# Patient Record
Sex: Female | Born: 1945 | Race: Black or African American | Hispanic: No | Marital: Single | Smoking: Never smoker
Health system: Southern US, Community
[De-identification: ages and names within clinical notes are randomized; demographics above are authoritative.]

---

## 2007-08-15 ENCOUNTER — Encounter: Admission: RE | Admit: 2007-08-15 | Discharge: 2007-11-13 | Payer: Self-pay | Admitting: Orthopedic Surgery

## 2007-09-12 ENCOUNTER — Ambulatory Visit (HOSPITAL_COMMUNITY): Admission: RE | Admit: 2007-09-12 | Discharge: 2007-09-12 | Payer: Self-pay | Admitting: Internal Medicine

## 2009-03-24 ENCOUNTER — Emergency Department (HOSPITAL_BASED_OUTPATIENT_CLINIC_OR_DEPARTMENT_OTHER): Admission: EM | Admit: 2009-03-24 | Discharge: 2009-03-24 | Payer: Self-pay | Admitting: Emergency Medicine

## 2014-10-03 DIAGNOSIS — C50312 Malignant neoplasm of lower-inner quadrant of left female breast: Secondary | ICD-10-CM | POA: Insufficient documentation

## 2014-10-04 DIAGNOSIS — Z8711 Personal history of peptic ulcer disease: Secondary | ICD-10-CM | POA: Insufficient documentation

## 2014-10-04 DIAGNOSIS — I10 Essential (primary) hypertension: Secondary | ICD-10-CM | POA: Insufficient documentation

## 2014-10-04 DIAGNOSIS — Z8719 Personal history of other diseases of the digestive system: Secondary | ICD-10-CM | POA: Insufficient documentation

## 2014-10-04 DIAGNOSIS — K635 Polyp of colon: Secondary | ICD-10-CM | POA: Insufficient documentation

## 2016-07-26 DIAGNOSIS — E119 Type 2 diabetes mellitus without complications: Secondary | ICD-10-CM | POA: Insufficient documentation

## 2016-08-02 DIAGNOSIS — E782 Mixed hyperlipidemia: Secondary | ICD-10-CM | POA: Insufficient documentation

## 2016-09-22 DIAGNOSIS — Z79811 Long term (current) use of aromatase inhibitors: Secondary | ICD-10-CM | POA: Insufficient documentation

## 2016-09-22 DIAGNOSIS — Z5181 Encounter for therapeutic drug level monitoring: Secondary | ICD-10-CM | POA: Insufficient documentation

## 2016-09-22 DIAGNOSIS — Z9189 Other specified personal risk factors, not elsewhere classified: Secondary | ICD-10-CM | POA: Insufficient documentation

## 2016-11-09 DIAGNOSIS — G8929 Other chronic pain: Secondary | ICD-10-CM | POA: Insufficient documentation

## 2016-11-09 DIAGNOSIS — M5441 Lumbago with sciatica, right side: Secondary | ICD-10-CM

## 2016-11-11 DIAGNOSIS — M5136 Other intervertebral disc degeneration, lumbar region: Secondary | ICD-10-CM | POA: Insufficient documentation

## 2017-01-26 DIAGNOSIS — Z9189 Other specified personal risk factors, not elsewhere classified: Secondary | ICD-10-CM | POA: Insufficient documentation

## 2017-09-14 DIAGNOSIS — M65332 Trigger finger, left middle finger: Secondary | ICD-10-CM | POA: Insufficient documentation

## 2018-06-22 DIAGNOSIS — S52124A Nondisplaced fracture of head of right radius, initial encounter for closed fracture: Secondary | ICD-10-CM | POA: Insufficient documentation

## 2018-06-22 DIAGNOSIS — S42409A Unspecified fracture of lower end of unspecified humerus, initial encounter for closed fracture: Secondary | ICD-10-CM | POA: Insufficient documentation

## 2018-12-22 ENCOUNTER — Other Ambulatory Visit: Payer: Self-pay | Admitting: Podiatry

## 2018-12-22 ENCOUNTER — Encounter: Payer: Self-pay | Admitting: Podiatry

## 2018-12-22 ENCOUNTER — Ambulatory Visit (INDEPENDENT_AMBULATORY_CARE_PROVIDER_SITE_OTHER): Payer: Medicare Other

## 2018-12-22 ENCOUNTER — Ambulatory Visit: Payer: Medicare Other | Admitting: Podiatry

## 2018-12-22 DIAGNOSIS — M7750 Other enthesopathy of unspecified foot: Secondary | ICD-10-CM

## 2018-12-22 DIAGNOSIS — M898X7 Other specified disorders of bone, ankle and foot: Secondary | ICD-10-CM | POA: Diagnosis not present

## 2018-12-22 DIAGNOSIS — M898X9 Other specified disorders of bone, unspecified site: Secondary | ICD-10-CM

## 2018-12-22 DIAGNOSIS — M19071 Primary osteoarthritis, right ankle and foot: Secondary | ICD-10-CM | POA: Diagnosis not present

## 2018-12-22 DIAGNOSIS — M19072 Primary osteoarthritis, left ankle and foot: Secondary | ICD-10-CM | POA: Diagnosis not present

## 2018-12-22 DIAGNOSIS — M19079 Primary osteoarthritis, unspecified ankle and foot: Secondary | ICD-10-CM

## 2018-12-22 DIAGNOSIS — G5763 Lesion of plantar nerve, bilateral lower limbs: Secondary | ICD-10-CM | POA: Diagnosis not present

## 2018-12-23 NOTE — Progress Notes (Signed)
  Subjective:  Patient ID: Angie [name], female    DOB: 07-Dec-1945,  MRN: [id number]  Chief Complaint  Patient presents with  . Foot Pain    Dorsal midfoot bilateral - tender, swollen, knots x several months, certain shoes aggravate, tried bengay  . Foot Pain    Plantar forefoot bilateral and some arch bilateral - aching x few months, intermittent pain, concerned about bunions-previous surgery on right, tried bengay and salonpas patch  . Diabetes    last a1c 6.0  . New Patient (Initial Visit)   73 y.o. female presents with the above complaint.  Above history confirmed with patient.  Review of Systems: Negative except as noted in the HPI. Denies N/V/F/Ch.  No past medical history on file.  Current Outpatient Medications:  .  amLODipine (NORVASC) 10 MG tablet, Take 10 mg by mouth every morning., Disp: , Rfl:  .  hydrochlorothiazide (HYDRODIURIL) 12.5 MG tablet, , Disp: , Rfl:  .  letrozole (FEMARA) 2.5 MG tablet, , Disp: , Rfl:  .  omeprazole (PRILOSEC) 40 MG capsule, Take 40 mg by mouth 2 (two) times daily., Disp: , Rfl:   Social History   Tobacco Use  Smoking Status Never Smoker  Smokeless Tobacco Never Used    Allergies  Allergen Reactions  . Amoxicillin Itching and Rash  . Nsaids Other (See Comments)   Objective:  There were no vitals filed for this visit. There is no height or weight on file to calculate BMI. Constitutional Well developed. Well nourished.  Vascular Dorsalis pedis pulses palpable bilaterally. Posterior tibial pulses palpable bilaterally. Capillary refill normal to all digits.  No cyanosis or clubbing noted. Pedal hair growth normal.  Neurologic Normal speech. Oriented to person, place, and time. Epicritic sensation to light touch grossly present bilaterally.  Dermatologic Nails well groomed and normal in appearance. No open wounds. No skin lesions.  Orthopedic: Normal joint ROM without pain or crepitus bilaterally. Pain palpation about the  dorsal midfoot bilaterally.  Pain to palpation about the third interspace with Mulder's click.  Prior bunionectomy right with healed incision without pain to palpation left HAV deformity noted   Radiographs: Taken and reviewed midfoot degenerative changes noted with dorsal spurring.  Hallux abductovalgus deformity left previous bunion surgery with intact hardware Assessment:   1. Exostosis of bone of foot   2. Arthritis of midfoot   3. Morton's metatarsalgia, neuralgia, or neuroma, bilateral    Plan:  Patient was evaluated and treated and all questions answered.  Midfoot arthritis bilaterally -Injection delivered as below  Procedure: Joint Injection Location: Bilateral dorsal TMTs joint Skin Prep: Alcohol. Injectate: 0.5 cc 1% lidocaine plain, 0.5 cc dexamethasone phosphate. Disposition: Patient tolerated procedure well. Injection site dressed with a band-aid.  Morton's neuroma bilaterally -Injection delivered as below  Procedure: Neuroma Injection Location: Bilateral 3rd interspace Skin Prep: Alcohol. Injectate: 0.5 cc 0.5% marcaine plain, 0.5 cc dexamethasone phosphate. Disposition: Patient tolerated procedure well. Injection site dressed with a band-aid.  Return in about 6 weeks (around 02/02/2019) for Neuroma, Capsulitis.

## 2019-01-18 ENCOUNTER — Ambulatory Visit: Payer: Medicare Other | Admitting: Podiatry

## 2019-03-01 ENCOUNTER — Other Ambulatory Visit: Payer: Self-pay

## 2019-03-01 ENCOUNTER — Ambulatory Visit: Payer: Medicare Other | Admitting: Podiatry

## 2019-03-01 VITALS — Temp 97.9°F

## 2019-03-01 DIAGNOSIS — M19079 Primary osteoarthritis, unspecified ankle and foot: Secondary | ICD-10-CM | POA: Diagnosis not present

## 2019-03-01 DIAGNOSIS — M898X7 Other specified disorders of bone, ankle and foot: Secondary | ICD-10-CM | POA: Diagnosis not present

## 2019-03-01 DIAGNOSIS — G5763 Lesion of plantar nerve, bilateral lower limbs: Secondary | ICD-10-CM

## 2019-03-01 NOTE — Progress Notes (Signed)
  Subjective:  Patient ID: Angie Angie [name], Angie [name]    DOB: [birth year],  MRN: [id number]  Chief Complaint  Patient presents with  . Foot Pain    Pt states injections are very effective and now only has very mild occasional pain. Pt would like to discuss the "swelling knots" on dorsal feet bilateral"   73 y.o. Angie [name] presents with the above complaint.  Above history confirmed with patient.  Review of Systems: Negative except as noted in the HPI. Denies N/V/F/Ch.  No past medical history on file.  Current Outpatient Medications:  .  amLODipine (NORVASC) 10 MG tablet, Take 10 mg by mouth every morning., Disp: , Rfl:  .  cyclobenzaprine (FLEXERIL) 10 MG tablet, TAKE 1 TABLET BY MOUTH EVERY 3 HOURS, Disp: , Rfl:  .  hydrochlorothiazide (HYDRODIURIL) 12.5 MG tablet, , Disp: , Rfl:  .  letrozole (FEMARA) 2.5 MG tablet, , Disp: , Rfl:  .  omeprazole (PRILOSEC) 40 MG capsule, Take 40 mg by mouth 2 (two) times daily., Disp: , Rfl:  .  traMADol (ULTRAM) 50 MG tablet, TAKE ONE TABLET (50 MG DOSE) BY MOUTH EVERY 6 (SIX) HOURS AS NEEDED FOR UP TO 3 DAYS., Disp: , Rfl:   Social History   Tobacco Use  Smoking Status Never Smoker  Smokeless Tobacco Never Used    Allergies  Allergen Reactions  . Amoxicillin Itching and Rash  . Nsaids Other (See Comments)   Objective:   Vitals:   03/01/19 0919  Temp: 97.9 F (36.6 C)   There is no height or weight on file to calculate BMI. Constitutional Well developed. Well nourished.  Vascular Dorsalis pedis pulses palpable bilaterally. Posterior tibial pulses palpable bilaterally. Capillary refill normal to all digits.  No cyanosis or clubbing noted. Pedal hair growth normal.  Neurologic Normal speech. Oriented to person, place, and time. Epicritic sensation to light touch grossly present bilaterally.  Dermatologic Nails well groomed and normal in appearance. No open wounds. No skin lesions.  Orthopedic: Normal joint ROM without pain or crepitus  bilaterally. Pain palpation about the dorsal midfoot bilaterally.  Pain to palpation about the third interspace with Mulder's click.  Prior bunionectomy right with healed incision without pain to palpation left HAV deformity noted   Radiographs: None today Assessment:   1. Morton's metatarsalgia, neuralgia, or neuroma, bilateral   2. Arthritis of midfoot   3. Exostosis of bone of foot    Plan:  Patient was evaluated and treated and all questions answered.  Midfoot Arthritis -Improved. No pain today.  Morton's neuroma bilaterally -Pain resolved. Nerve symptoms remain. Explained etiology. Declines injection.  Return in about 2 months (around 05/01/2019) for Neuroma, Bilateral.

## 2019-04-13 DIAGNOSIS — Z8544 Personal history of malignant neoplasm of other female genital organs: Secondary | ICD-10-CM | POA: Insufficient documentation

## 2019-05-03 ENCOUNTER — Ambulatory Visit: Payer: Medicare Other | Admitting: Podiatry

## 2019-05-16 DIAGNOSIS — Z9189 Other specified personal risk factors, not elsewhere classified: Secondary | ICD-10-CM | POA: Insufficient documentation

## 2019-06-24 DIAGNOSIS — Z96653 Presence of artificial knee joint, bilateral: Secondary | ICD-10-CM | POA: Insufficient documentation

## 2019-07-13 DIAGNOSIS — C519 Malignant neoplasm of vulva, unspecified: Secondary | ICD-10-CM | POA: Insufficient documentation

## 2020-07-03 ENCOUNTER — Ambulatory Visit: Payer: Self-pay | Admitting: Family Medicine

## 2020-07-03 ENCOUNTER — Encounter: Payer: Self-pay | Admitting: Family Medicine

## 2020-07-03 ENCOUNTER — Ambulatory Visit: Payer: Self-pay

## 2020-07-03 ENCOUNTER — Other Ambulatory Visit: Payer: Self-pay

## 2020-07-03 VITALS — BP 149/96 | HR 66 | Ht 68.0 in | Wt 274.0 lb

## 2020-07-03 DIAGNOSIS — S63502A Unspecified sprain of left wrist, initial encounter: Secondary | ICD-10-CM | POA: Insufficient documentation

## 2020-07-03 DIAGNOSIS — M25532 Pain in left wrist: Secondary | ICD-10-CM

## 2020-07-03 NOTE — Progress Notes (Signed)
Angie [name] - 74 y.o. female MRN [id number]  Date of birth: Jul 22, 1946  SUBJECTIVE:  Including CC & ROS.  Chief Complaint  Patient presents with  . Wrist Injury    left x 06/26/2020    Angie [name] is a 74 y.o. female that is presenting with left wrist pain.  She had a fall over about a week ago.  She was seen in the emergency department today and x-rays were taken which were negative for fracture.  She is having pain over the ulnar aspect of the wrist.  No history of previous injury or pain to the area..  Review of the left wrist x-ray from 9/9 shows soft tissue swelling.   Review of Systems See HPI   HISTORY: Past Medical, Surgical, Social, and Family History Reviewed & Updated per EMR.   Pertinent Historical Findings include:  History reviewed. No pertinent past medical history.  History reviewed. No pertinent surgical history.  History reviewed. No pertinent family history.  Social History   Socioeconomic History  . Marital status: Single    Spouse name: Not on file  . Number of children: Not on file  . Years of education: Not on file  . Highest education level: Not on file  Occupational History  . Not on file  Tobacco Use  . Smoking status: Never Smoker  . Smokeless tobacco: Never Used  Substance and Sexual Activity  . Alcohol use: Not Currently  . Drug use: Not on file  . Sexual activity: Not on file  Other Topics Concern  . Not on file  Social History Narrative  . Not on file   Social Determinants of Health   Financial Resource Strain:   . Difficulty of Paying Living Expenses: Not on file  Food Insecurity:   . Worried About Charity fundraiser in the Last Year: Not on file  . Ran Out of Food in the Last Year: Not on file  Transportation Needs:   . Lack of Transportation (Medical): Not on file  . Lack of Transportation (Non-Medical): Not on file  Physical Activity:   . Days of Exercise per Week: Not on file  . Minutes of Exercise per Session: Not on  file  Stress:   . Feeling of Stress : Not on file  Social Connections:   . Frequency of Communication with Friends and Family: Not on file  . Frequency of Social Gatherings with Friends and Family: Not on file  . Attends Religious Services: Not on file  . Active Member of Clubs or Organizations: Not on file  . Attends Archivist Meetings: Not on file  . Marital Status: Not on file  Intimate Partner Violence:   . Fear of Current or Ex-Partner: Not on file  . Emotionally Abused: Not on file  . Physically Abused: Not on file  . Sexually Abused: Not on file     PHYSICAL EXAM:  VS: BP (!) 149/96   Pulse 66   Ht 5\' 8"  (1.727 m)   Wt 274 lb (124.3 kg)   BMI 41.66 kg/m  Physical Exam Gen: NAD, alert, cooperative with exam, well-appearing MSK:  Left wrist: Tenderness palpation of the TFCC. Normal range of motion. No ecchymosis or swelling. Normal grip strength. Neurovascularly intact  Limited ultrasound: Left wrist:  Normal-appearing ECU midsubstance and at the insertion. No changes at the base of the fifth metacarpal. Mild effusion of the carpal bones. No increased hyperemia. No fracture of the distal ulna or radius  Summary: Mild effusion of the wrist joint.  Ultrasound and interpretation by Clearance Coots, MD   ASSESSMENT & PLAN:   Wrist sprain, left, initial encounter Injury occurred on 9/2.  No fracture identified on initial x-ray.  No ligamentous or tendon abnormalities observed on ultrasound today.  May have an exacerbation of underlying degenerative changes. -Counseled on supportive care. -Brace. -Follow-up and would consider reimaging.

## 2020-07-03 NOTE — Patient Instructions (Signed)
Nice to meet you Please try the brace  Please try ice  Please try the range of motion movements   Please send me a message in MyChart with any questions or updates.  Please see me back in 1-2 weeks.   --Dr. Raeford Razor

## 2020-07-03 NOTE — Assessment & Plan Note (Signed)
Injury occurred on 9/2.  No fracture identified on initial x-ray.  No ligamentous or tendon abnormalities observed on ultrasound today.  May have an exacerbation of underlying degenerative changes. -Counseled on supportive care. -Brace. -Follow-up and would consider reimaging.

## 2020-07-17 ENCOUNTER — Ambulatory Visit: Payer: Self-pay | Admitting: Family Medicine

## 2020-09-02 ENCOUNTER — Telehealth: Payer: Self-pay | Admitting: Podiatry

## 2020-09-02 NOTE — Telephone Encounter (Signed)
Pt left message with only name and but then called back asking about getting another pair of orthotics.  Upon discussion pt was talking about powersteps not custom orthotics and I told pt she could just stop by the [facility] office and purchase a pair of powersteps for 55.00 plus tax. She said thank you.

## 2020-09-02 NOTE — Telephone Encounter (Signed)
Got it - thanks.

## 2020-10-03 ENCOUNTER — Ambulatory Visit: Payer: Medicare Other | Admitting: Podiatry

## 2020-10-10 ENCOUNTER — Ambulatory Visit (INDEPENDENT_AMBULATORY_CARE_PROVIDER_SITE_OTHER): Payer: Medicare Other

## 2020-10-10 ENCOUNTER — Other Ambulatory Visit: Payer: Self-pay

## 2020-10-10 ENCOUNTER — Ambulatory Visit (INDEPENDENT_AMBULATORY_CARE_PROVIDER_SITE_OTHER): Payer: Medicare Other | Admitting: Podiatry

## 2020-10-10 DIAGNOSIS — M722 Plantar fascial fibromatosis: Secondary | ICD-10-CM | POA: Diagnosis not present

## 2020-10-10 DIAGNOSIS — M79671 Pain in right foot: Secondary | ICD-10-CM

## 2020-10-10 MED ORDER — BETAMETHASONE SOD PHOS & ACET 6 (3-3) MG/ML IJ SUSP
6.0000 mg | Freq: Once | INTRAMUSCULAR | Status: AC
  Administered 2020-10-10: 6 mg

## 2020-10-21 ENCOUNTER — Other Ambulatory Visit: Payer: Self-pay | Admitting: Podiatry

## 2020-10-21 DIAGNOSIS — M722 Plantar fascial fibromatosis: Secondary | ICD-10-CM

## 2020-10-24 NOTE — Progress Notes (Signed)
  Subjective:  Patient ID: Zavannah Deblois Bernick, female    DOB: [birth year],  MRN: [id number]  Chief Complaint  Patient presents with  . Foot Pain    Right plantar heel pain 1 month duration no known injuries  . Leg Problem    Pt states right lower extremity swelling, no known injuries.    74 y.o. female presents with the above complaint. History confirmed with patient.   Objective:  Physical Exam: warm, good capillary refill, no trophic changes or ulcerative lesions, normal DP and PT pulses and normal sensory exam. Right Foot: tenderness to palpation medial calcaneal tuber, no pain with calcaneal squeeze, decreased ankle joint ROM and +Silverskiold test normal exam, no swelling, tenderness, instability; ligaments intact, full range of motion of all ankle/foot joints other than findings noted above.  Radiographs: X-ray of the right foot: no evidence of calcaneal stress fracture, plantar calcaneal spur, posterior calcaneal spur and Haglund deformity noted  Assessment:   1. Plantar fasciitis      Plan:  Patient was evaluated and treated and all questions answered.  Plantar Fasciitis -XR reviewed with patient -Educated patient on stretching and icing of the affected limb -Plantar fascial brace dispensed -Injection delivered to the plantar fascia of the right foot.  Procedure: Injection Tendon/Ligament Consent: Verbal consent obtained. Location: Right plantar fascia at the glabrous junction; medial approach. Skin Prep: Alcohol. Injectate: 1 cc 0.5% marcaine plain, 1 cc dexamethasone phosphate, 0.5 cc kenalog 10. Disposition: Patient tolerated procedure well. Injection site dressed with a band-aid.    Return in about 1 month (around 11/10/2020) for Plantar fasciitis.

## 2020-11-18 ENCOUNTER — Ambulatory Visit: Payer: Medicare Other | Admitting: Podiatry

## 2020-11-18 ENCOUNTER — Other Ambulatory Visit: Payer: Self-pay

## 2020-11-18 DIAGNOSIS — G5763 Lesion of plantar nerve, bilateral lower limbs: Secondary | ICD-10-CM | POA: Diagnosis not present

## 2020-11-18 DIAGNOSIS — M19079 Primary osteoarthritis, unspecified ankle and foot: Secondary | ICD-10-CM

## 2020-11-18 DIAGNOSIS — M722 Plantar fascial fibromatosis: Secondary | ICD-10-CM

## 2020-11-18 DIAGNOSIS — M2012 Hallux valgus (acquired), left foot: Secondary | ICD-10-CM

## 2020-11-18 NOTE — Progress Notes (Signed)
  Subjective:  Patient ID: Angie [name], female    DOB: 01-01-1946,  MRN: [id number]  Chief Complaint  Patient presents with  . Plantar Fasciitis    F/U Lt PF:  Pt states,' doing some what better, still with some pain; 4/10." - w/ occasional swelling with prolong standing tx: brace, stretching and icing   75 y.o. female presents with the above complaint. History confirmed with patient. New complaint of left foot bunion that occasionally causes pain.  Objective:  Physical Exam: warm, good capillary refill, no trophic changes or ulcerative lesions, normal DP and PT pulses and normal sensory exam. Right Foot: tenderness to palpation medial calcaneal tuber, no pain with calcaneal squeeze, decreased ankle joint ROM and +Silverskiold test normal exam, no swelling, tenderness, instability; ligaments intact, full range of motion of all ankle/foot joints other than findings noted above Left foot: HAV let, prominent bursa with POP. Scant discoloration no active warmth or erythema.  Assessment:   1. Plantar fasciitis   2. Hallux valgus, left   3. Morton's metatarsalgia, neuralgia, or neuroma, bilateral   4. Arthritis of midfoot      Plan:  Patient was evaluated and treated and all questions answered.  Plantar Fasciitis -Defer injection today. -Continue use of plantar fascial brace for 2 weeks.  Left foot HAV -Discussed possible surgical correction, patient to think it over. Would consider MIS approach. -Tubefoam bunion shield dispensed. -XR next visit for further eval of bunion, possible surgical discussion.  Return in about 6 weeks (around 12/30/2020) for Plantar fasciitis R, Bunion L.

## 2020-12-30 ENCOUNTER — Ambulatory Visit (INDEPENDENT_AMBULATORY_CARE_PROVIDER_SITE_OTHER): Payer: Medicare Other | Admitting: Podiatry

## 2020-12-30 ENCOUNTER — Other Ambulatory Visit: Payer: Self-pay

## 2020-12-30 DIAGNOSIS — M722 Plantar fascial fibromatosis: Secondary | ICD-10-CM

## 2020-12-30 DIAGNOSIS — M2012 Hallux valgus (acquired), left foot: Secondary | ICD-10-CM

## 2020-12-30 NOTE — Patient Instructions (Signed)
Charge for [employer]

## 2021-01-22 NOTE — Progress Notes (Signed)
  Subjective:  Patient ID: Chelsye Suhre Toner, female    DOB: 08-27-1946,  MRN: [id number]  Chief Complaint  Patient presents with  . Bunions  . Plantar Fasciitis    Plantar fasciitis R, Bunion L 6 wk fu   75 y.o. female presents with the above complaint. History confirmed with patient.  States that during the day she is on her feet a lot and the feet still hurt a bit but she feels improvement with the brace.  Would like to continue trying her orthotics  Objective:  Physical Exam: warm, good capillary refill, no trophic changes or ulcerative lesions, normal DP and PT pulses and normal sensory exam. Right Foot: tenderness to palpation medial calcaneal tuber, no pain with calcaneal squeeze, decreased ankle joint ROM and +Silverskiold test normal exam, no swelling, tenderness, instability; ligaments intact, full range of motion of all ankle/foot joints other than findings noted above Left foot: HAV let, prominent bursa with POP. Scant discoloration no active warmth or erythema.  Assessment:   1. Plantar fasciitis   2. Hallux valgus, left      Plan:  Patient was evaluated and treated and all questions answered.  Plantar Fasciitis -Improved  Left foot HAV -Offered possible surgical intervention, patient not interested at this time.  Return if symptoms worsen or fail to improve.

## 2021-04-17 ENCOUNTER — Ambulatory Visit (INDEPENDENT_AMBULATORY_CARE_PROVIDER_SITE_OTHER): Payer: Medicare Other

## 2021-04-17 ENCOUNTER — Ambulatory Visit (INDEPENDENT_AMBULATORY_CARE_PROVIDER_SITE_OTHER): Payer: Medicare Other | Admitting: Podiatry

## 2021-04-17 ENCOUNTER — Other Ambulatory Visit: Payer: Self-pay

## 2021-04-17 ENCOUNTER — Other Ambulatory Visit: Payer: Self-pay | Admitting: Podiatry

## 2021-04-17 DIAGNOSIS — M722 Plantar fascial fibromatosis: Secondary | ICD-10-CM

## 2021-04-17 DIAGNOSIS — G5763 Lesion of plantar nerve, bilateral lower limbs: Secondary | ICD-10-CM

## 2021-04-17 DIAGNOSIS — M79672 Pain in left foot: Secondary | ICD-10-CM

## 2021-04-17 DIAGNOSIS — M2012 Hallux valgus (acquired), left foot: Secondary | ICD-10-CM | POA: Diagnosis not present

## 2021-04-17 NOTE — Progress Notes (Signed)
  Subjective:  Patient ID: Angie [name], female    DOB: [birth year],  MRN: [id number]  Chief Complaint  Patient presents with   Bunions    Left bunion pain   Numbness    Pt states numbness in bilateral plantar forefoot   75 y.o. female presents with the above complaint. History confirmed with patient.   Objective:  Physical Exam: warm, good capillary refill, no trophic changes or ulcerative lesions, normal DP and PT pulses and normal sensory exam. Right Foot: tenderness to palpation medial calcaneal tuber, no pain with calcaneal squeeze, decreased ankle joint ROM and +Silverskiold test normal exam, no swelling, tenderness, instability; ligaments intact, full range of motion of all ankle/foot joints other than findings noted above Left foot: HAV let, prominent bursa with POP. Scant discoloration no active warmth or erythema.  Assessment:   1. Hallux valgus, left      Plan:  Patient was evaluated and treated and all questions answered.  Plantar Fasciitis -Improved  Left foot HAV -Patient has failed all conservative therapy and wishes to proceed with surgical intervention. All risks, benefits, and alternatives discussed with patient. No guarantees given. Consent reviewed and signed by patient. -Planned procedures: left foot correction of HAV deformity with Liane Comber bunionectomy -ASA 3 - Patient with moderate systemic disease with functional limitations; Risk factors -Post-op anticoagulation: chemoprophylaxis not indicated -DME dispensed for post-op use: CAM Boot -IVS RUE only given hx of breast surgery affecting LUE.  Morton neuroma bilat -Offered concomitant excision at time of surgery for bunion, pt declined -Continue CMOs with metatarsal pads.  Return for Post-op Care.

## 2021-04-20 ENCOUNTER — Telehealth: Payer: Self-pay | Admitting: Urology

## 2021-04-20 NOTE — Telephone Encounter (Signed)
DOS - 05/13/21   [place] BUNIONECTOMY LEFT --- [id number]   [employer] EFFECTIVE DATE - 10/25/20   PLAN DEDUCTIBLE - $0.00 OUT OF POCKET - $2,500.00 W/ $2,385.00 REMAINING COINSURANCE - 0%  COPAY - $50.00   PER [employer] WEB SITE CPT CODE 82641 Notification or Prior Authorization is not required for the requested services   Decision ID #:[id number]

## 2021-05-13 ENCOUNTER — Other Ambulatory Visit: Payer: Self-pay | Admitting: Podiatry

## 2021-05-13 ENCOUNTER — Encounter: Payer: Self-pay | Admitting: Podiatry

## 2021-05-13 DIAGNOSIS — M2012 Hallux valgus (acquired), left foot: Secondary | ICD-10-CM | POA: Diagnosis not present

## 2021-05-13 MED ORDER — CLINDAMYCIN HCL 150 MG PO CAPS: 150.0000 mg | ORAL_CAPSULE | Freq: Two times a day (BID) | ORAL | 0 refills | Status: AC

## 2021-05-13 MED ORDER — OXYCODONE-ACETAMINOPHEN 5-325 MG PO TABS: 1.0000 | ORAL_TABLET | ORAL | 0 refills | Status: DC | PRN

## 2021-05-14 ENCOUNTER — Telehealth: Payer: Self-pay | Admitting: Podiatry

## 2021-05-14 ENCOUNTER — Telehealth: Payer: Self-pay | Admitting: *Deleted

## 2021-05-14 DIAGNOSIS — M2012 Hallux valgus (acquired), left foot: Secondary | ICD-10-CM

## 2021-05-14 NOTE — Addendum Note (Signed)
Addended by: Hardie Pulley on: 05/14/2021 01:36 PM   Modules accepted: Orders

## 2021-05-14 NOTE — Telephone Encounter (Signed)
Pt called and stated that she need Dr. March Rummage to send over a prescription to [place] for a Wheelchair.  Patient had sx yesterday  Please advise

## 2021-05-14 NOTE — Telephone Encounter (Signed)
Faxed wheelchair to [facility] in [place] per patient's request,received confirmation.

## 2021-05-18 NOTE — Telephone Encounter (Signed)
I faxed over an order to [facility] for a wheel chair for patient today. Angie [name]

## 2021-05-19 ENCOUNTER — Ambulatory Visit (INDEPENDENT_AMBULATORY_CARE_PROVIDER_SITE_OTHER): Payer: Medicare Other | Admitting: Podiatry

## 2021-05-19 ENCOUNTER — Other Ambulatory Visit: Payer: Self-pay

## 2021-05-19 ENCOUNTER — Ambulatory Visit (INDEPENDENT_AMBULATORY_CARE_PROVIDER_SITE_OTHER): Payer: Medicare Other

## 2021-05-19 DIAGNOSIS — Z9889 Other specified postprocedural states: Secondary | ICD-10-CM

## 2021-05-19 DIAGNOSIS — M2012 Hallux valgus (acquired), left foot: Secondary | ICD-10-CM

## 2021-05-19 NOTE — Progress Notes (Signed)
  Subjective:  Patient ID: Angie [name], female    DOB: 08-11-1946,  MRN: [id number]  Chief Complaint  Patient presents with   Routine Post Op    POV #1 DOS 05/13/2021 [facility] BUNIONECTOMY LT. Pt is doing well. Some edema today.     DOS: 05/13/21 Procedure: Liane Comber Bunionectomy left  75 y.o. female presents with the above complaint. History confirmed with patient.   Objective:  Physical Exam: tenderness at the surgical site, local edema noted, and calf supple, nontender. Incision: healing well, no significant drainage, no dehiscence, no significant erythema  No images are attached to the encounter.  Radiographs: X-ray of the left foot: consistent with post-op state, with good alignment and no evidence of hardware complication  Assessment:   1. Post-operative state   2. Hallux valgus, left     Plan:  Patient was evaluated and treated and all questions answered.  Post-operative State -XR reviewed with patient -Dressing applied consisting of sterile gauze and kerlix -WBAT in CAM boot -XRs needed at follow-up: none  No follow-ups on file.

## 2021-05-20 ENCOUNTER — Telehealth: Payer: Self-pay | Admitting: *Deleted

## 2021-05-20 NOTE — Telephone Encounter (Signed)
Patient is calling for the status of an order for a wheelchair from [facility]. Please advise.  Called patient to inform that order was sent to [facility] ,she said that she wanted it sent to the [facility] office.  Resent  order to the [facility] office , patient aware.

## 2021-05-28 ENCOUNTER — Other Ambulatory Visit: Payer: Self-pay | Admitting: *Deleted

## 2021-05-28 MED ORDER — OXYCODONE-ACETAMINOPHEN 5-325 MG PO TABS: 1.0000 | ORAL_TABLET | ORAL | 0 refills | Status: AC | PRN

## 2021-05-28 NOTE — Telephone Encounter (Signed)
Was refilled in separate encounter

## 2021-05-28 NOTE — Telephone Encounter (Signed)
Patient is calling to request a refill of pain medication, still having foot pain. Please advise.

## 2021-06-02 ENCOUNTER — Ambulatory Visit (INDEPENDENT_AMBULATORY_CARE_PROVIDER_SITE_OTHER): Payer: Medicare Other | Admitting: Podiatry

## 2021-06-02 ENCOUNTER — Other Ambulatory Visit: Payer: Self-pay

## 2021-06-02 DIAGNOSIS — M2012 Hallux valgus (acquired), left foot: Secondary | ICD-10-CM

## 2021-06-02 DIAGNOSIS — Z9889 Other specified postprocedural states: Secondary | ICD-10-CM

## 2021-06-02 NOTE — Progress Notes (Signed)
  Subjective:  Patient ID: Desare Diers Sprunger, female    DOB: 1946/09/13,  MRN: [id number]  Chief Complaint  Patient presents with   Routine Post Op    POV #2 DOS 05/13/2021 [place] BUNIONECTOMY LT. Pt complains of edema.    DOS: 05/13/21 Procedure: Liane Comber Bunionectomy left  75 y.o. female presents with the above complaint. History confirmed with patient.   Objective:  Physical Exam: tenderness at the surgical site, local edema noted, and calf supple, nontender. Incision: healing well, no significant drainage, no dehiscence, no significant erythema   Assessment:   1. Hallux valgus, left   2. Post-operative state    Plan:  Patient was evaluated and treated and all questions answered.  Post-operative State -Sutures removed -Dressing applied consisting of ACE bandage -WBAT in CAM boot -XRs needed at follow-up: none  Return in about 2 weeks (around 06/16/2021) for Post-Op (with XRs).

## 2021-06-16 ENCOUNTER — Ambulatory Visit (INDEPENDENT_AMBULATORY_CARE_PROVIDER_SITE_OTHER): Payer: Medicare Other

## 2021-06-16 ENCOUNTER — Other Ambulatory Visit: Payer: Self-pay

## 2021-06-16 ENCOUNTER — Ambulatory Visit (INDEPENDENT_AMBULATORY_CARE_PROVIDER_SITE_OTHER): Payer: Medicare Other | Admitting: Podiatry

## 2021-06-16 DIAGNOSIS — Z9889 Other specified postprocedural states: Secondary | ICD-10-CM

## 2021-06-16 DIAGNOSIS — M2012 Hallux valgus (acquired), left foot: Secondary | ICD-10-CM

## 2021-06-16 NOTE — Progress Notes (Signed)
  Subjective:  Patient ID: Angie [name], female    DOB: 1946-10-05,  MRN: [id number]  Chief Complaint  Patient presents with   Routine Post Op    PT stated that she is doing okay    DOS: 05/13/21 Procedure: Liane Comber Bunionectomy left  74 y.o. female presents with the above complaint. History confirmed with patient. Minimal pain. Walking in the boot as directed. Denies other issues.  Objective:  Physical Exam: no tenderness at the surgical site, local edema noted, and calf supple, nontender. Incision:well healed  Radiographs: taken and reviewed healing across the osteotomy noted. Assessment:   1. Post-operative state   2. Hallux valgus, left    Plan:  Patient was evaluated and treated and all questions answered.  Post-operative State -XR reviewed with patient -WBAT in Surgical shoe -Surgical shoe dispensed -XRs needed at follow-up: none  Return in about 2 weeks (around 06/30/2021) for Post-Op (with XRs).

## 2021-06-30 ENCOUNTER — Ambulatory Visit (INDEPENDENT_AMBULATORY_CARE_PROVIDER_SITE_OTHER): Payer: Medicare Other | Admitting: Podiatry

## 2021-06-30 ENCOUNTER — Ambulatory Visit (INDEPENDENT_AMBULATORY_CARE_PROVIDER_SITE_OTHER): Payer: Medicare Other

## 2021-06-30 ENCOUNTER — Other Ambulatory Visit: Payer: Self-pay

## 2021-06-30 DIAGNOSIS — Z9889 Other specified postprocedural states: Secondary | ICD-10-CM

## 2021-07-06 NOTE — Progress Notes (Signed)
  Subjective:  Patient ID: Angie [name], female    DOB: 25-Feb-1946,  MRN: [id number]  Chief Complaint  Patient presents with   Routine Post Op    Post-Op (with XRs). POV #4 DOS 05/13/2021 [place] BUNIONECTOMY LT. Pt states she has been doing well. Able to walk more but does complains of edema.    DOS: 05/13/21 Procedure: Liane Comber Bunionectomy left  75 y.o. female presents with the above complaint. History confirmed with patient.  Denies pain or discomfort but has some swelling. Ambulating Normal shoe gear without pain  Objective:  Physical Exam: no tenderness at the surgical site, local edema noted, and calf supple, nontender. Incision:well healed  Radiographs: taken and reviewed healing across the osteotomy noted. Assessment:   1. Post-operative state    Plan:  Patient was evaluated and treated and all questions answered.  Post-operative State -X-rays taken and reviewed full healing noted. -Foot appears well-healed patient pleased with results -Okay to transition to normal shoe gear as tolerated with follow-up as needed.  No follow-ups on file.

## 2021-08-11 ENCOUNTER — Ambulatory Visit (INDEPENDENT_AMBULATORY_CARE_PROVIDER_SITE_OTHER): Payer: Medicare Other | Admitting: Podiatry

## 2021-08-11 ENCOUNTER — Other Ambulatory Visit: Payer: Self-pay

## 2021-08-11 DIAGNOSIS — G8929 Other chronic pain: Secondary | ICD-10-CM

## 2021-08-11 DIAGNOSIS — M2012 Hallux valgus (acquired), left foot: Secondary | ICD-10-CM

## 2021-08-11 DIAGNOSIS — M25562 Pain in left knee: Secondary | ICD-10-CM

## 2021-08-11 DIAGNOSIS — Z9889 Other specified postprocedural states: Secondary | ICD-10-CM

## 2021-08-11 NOTE — Progress Notes (Signed)
  Subjective:  Patient ID: Angie [name], female    DOB: May 19, 1946,  MRN: [id number]  Chief Complaint  Patient presents with   Routine Post Op    POV #5 DOS 05/13/2021 [place] BUNIONECTOMY LT. Pt states some soreness and a little edema.    DOS: 05/13/21 Procedure: Liane Comber Bunionectomy left  75 y.o. female presents with the above complaint. History confirmed with patient.  States she has a little swelling but not difficulty with shoegear. No pain. Does feel like the foot overall is stiff, not really at the big toe area but more at the ankle. Objective:  Physical Exam: no tenderness at the surgical site, local edema noted, and calf supple, nontender. Tightness at the 1st MPJ, reducible with manual stretch. Decreased ankle ROM. Incision:well healed  Radiographs: taken and reviewed healing across the osteotomy noted. Assessment:   1. Post-operative state   2. Hallux valgus, left   3. Chronic pain of left knee    Plan:  Patient was evaluated and treated and all questions answered.  Post-operative State -Slighty reduced ROM, reducible on manual stretch. Discussed continued stretching exercises. Feels like her left ankle is tight as well. Will refer to PT to help improve ROM at both these areas. -Continue normal shoegear  Left knee pain -Refer to Sports Medicine for eval. Has hx of left knee replacement, with pain and swelling at the area.  No follow-ups on file.

## 2021-08-21 ENCOUNTER — Other Ambulatory Visit: Payer: Self-pay

## 2021-08-21 ENCOUNTER — Ambulatory Visit: Payer: Medicare Other | Admitting: Physical Therapy

## 2021-08-21 ENCOUNTER — Encounter: Payer: Self-pay | Admitting: Physical Therapy

## 2021-08-21 DIAGNOSIS — R262 Difficulty in walking, not elsewhere classified: Secondary | ICD-10-CM

## 2021-08-21 DIAGNOSIS — R29898 Other symptoms and signs involving the musculoskeletal system: Secondary | ICD-10-CM

## 2021-08-21 DIAGNOSIS — M25672 Stiffness of left ankle, not elsewhere classified: Secondary | ICD-10-CM

## 2021-08-21 DIAGNOSIS — R600 Localized edema: Secondary | ICD-10-CM

## 2021-08-21 DIAGNOSIS — M25662 Stiffness of left knee, not elsewhere classified: Secondary | ICD-10-CM | POA: Diagnosis not present

## 2021-08-21 NOTE — Patient Instructions (Signed)
Access Code: [email or web address] URL: [email or web address] Date: 08/21/2021 Prepared by: Isabelle Course  Exercises Gastroc Stretch on Wall - 1 x daily - 7 x weekly - 3 sets - 1 reps - 20-30 sec hold Long Sitting Calf Stretch with Strap - 1 x daily - 7 x weekly - 3 sets - 1 reps - 20-30 sec hold Soleus Stretch on Wall - 1 x daily - 7 x weekly - 3 sets - 1 reps - 20-30 sec hold Supine Heel Slide with Strap - 1 x daily - 7 x weekly - 1 sets - 10 reps - 10 seconds hold Seated Knee Flexion Extension AROM - 1 x daily - 7 x weekly - 1 sets - 10 reps Towel Scrunches - 1 x daily - 7 x weekly - 3 sets - 1 reps - 1 min hold Standing Single Leg Stance with Counter Support - 1 x daily - 7 x weekly - 3 sets - 1 reps - 10-15 seconds hold Active Straight Leg Raise with Quad Set - 1 x daily - 7 x weekly - 2 sets - 10 reps

## 2021-08-21 NOTE — Therapy (Signed)
[facility] [place], [place], [address] Phone: [phone]   Fax:  [phone]  Physical Therapy Evaluation  Patient Details  Name: Angie [name] MRN: [id number] Date of Birth: Jan 21, 1946 Referring Provider (PT): Hardie Pulley   Encounter Date: 08/21/2021   PT End of Session - 08/21/21 1058     Visit Number 1    Number of Visits 12    Date for PT Re-Evaluation 10/02/21    Authorization Type UHC medicare    Authorization - Visit Number 1    Progress Note Due on Visit 10    PT Start Time 7628    PT Stop Time 1100    PT Time Calculation (min) 45 min    Activity Tolerance Patient tolerated treatment well    Behavior During Therapy [facility] for tasks assessed/performed             History reviewed. No pertinent past medical history.  No past surgical history on file.  There were no vitals filed for this visit.    Subjective Assessment - 08/21/21 1017     Subjective Pt had a Lt bunionectomy 05/14/21 and continues to have decreased ROM and edema in Lt foot and ankle. Pt with minimal pain but she has decreased walking distance and standing tolerance. Pt states she has an increased fear of falling due to Lt knee stiffness and feeling like it will "give out". 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 less than 5 minutes    How long can you walk comfortably? 10-15 minutes    Patient Stated Goals improve knee and ankle range of motion and strength    Currently in Pain? Yes    Pain Score 3     Pain Location Knee    Pain Orientation Left;Anterior    Pain Descriptors / Indicators Hervey Ard                [facility] - 08/21/21 0001       Assessment   Medical Diagnosis post op bunionectomy, Left hallux valgus     Referring Provider (PT) Hardie Pulley    Onset Date/Surgical Date 05/14/21    Hand Dominance Right    Next MD Visit 6 weeks      Precautions   Precautions None      Restrictions   Weight Bearing Restrictions No      Balance Screen   Has the patient fallen in the past 6 months No      Prior Function   Level of Independence Independent      Observation/Other Assessments   Focus on Therapeutic Outcomes (FOTO)  not assessed      Functional Tests   Functional tests Single leg stance      Single Leg Stance   Comments Rt LE 2 seconds, Lt LE unable      ROM / Strength   AROM / PROM / Strength AROM;Strength      AROM   AROM Assessment Site Knee;Ankle    Right/Left Knee Right;Left    Right Knee Extension 0    Right Knee Flexion 113    Left Knee Extension 0    Left Knee Flexion 86    Right/Left Ankle Right;Left    Right Ankle Dorsiflexion 0  Right Ankle Plantar Flexion 35    Right Ankle Inversion 15    Right Ankle Eversion 32    Left Ankle Dorsiflexion -5    Left Ankle Plantar Flexion 35    Left Ankle Inversion 30    Left Ankle Eversion 15      Strength   Strength Assessment Site Knee;Ankle    Right/Left Knee Right;Left    Right Knee Flexion 4+/5    Right Knee Extension 4+/5    Left Knee Flexion 4+/5    Left Knee Extension 4+/5    Right/Left Ankle Right;Left    Right Ankle Dorsiflexion 4+/5    Right Ankle Plantar Flexion 4+/5    Right Ankle Inversion 4/5    Right Ankle Eversion 4/5    Left Ankle Dorsiflexion 4+/5    Left Ankle Plantar Flexion 4+/5    Left Ankle Inversion 4/5    Left Ankle Eversion 4/5      Palpation   Palpation comment hypomobile talocrural mobs LT ankle, hypomobile metarsals and Lt great toe flex/ext      Ambulation/Gait   Gait Comments antalgic gait with decreased Lt stance time                        Objective measurements completed on examination: See above findings.       [facility] Adult PT Treatment/Exercise -  08/21/21 0001       Exercises   Exercises Knee/Hip      Knee/Hip Exercises: Stretches   Knee: Self-Stretch to increase Flexion Left;2 reps;30 seconds    Knee: Self-Stretch Limitations seated heel slide    Gastroc Stretch 30 seconds    Soleus Stretch 30 seconds      Knee/Hip Exercises: Standing   SLS up to 10 seconds bilat at counter      Knee/Hip Exercises: Supine   Straight Leg Raises 10 reps      Manual Therapy   Manual Therapy Joint mobilization    Joint Mobilization AP grade 2-3 mobs to improve DF Lt ankle                     PT Education - 08/21/21 1051     Education Details HEP, PT POC and goals    Person(s) Educated Patient    Methods Explanation;Demonstration;Handout    Comprehension Verbalized understanding;Returned demonstration                 PT Long Term Goals - 08/21/21 1130       PT LONG TERM GOAL #1   Title Pt will be independent with HEP    Time 6    Period Weeks    Status New    Target Date 10/02/21      PT LONG TERM GOAL #2   Title Pt will improve Lt ankle ROM by 10 degrees in DF and PF to improve gait and standing tolerance    Time 6    Period Weeks    Status New    Target Date 10/02/21      PT LONG TERM GOAL #3   Title Pt will improve balance to perform SLS x 10 seconds on bilat LE to demo reduced fall risk    Time 6    Period Weeks    Status New    Target Date 10/02/21      PT LONG TERM GOAL #4   Title Pt will improve LT knee flexion ROM by 10 degrees  to improve gait tolerance    Time 6    Period Weeks    Status New    Target Date 10/02/21                    Plan - 08/21/21 1059     Clinical Impression Statement Pt is a 75 y/o female referred for Lt ankle stiffness s/p bunionectomy. Pt presents with decreased knee and ankle ROM, difficulty walking, decreased activity tolerance and impaired balance. Pt will benefit from skilled PT to address deficits and improve funcitonal mobility    Personal  Factors and Comorbidities Profession;Comorbidity 2;Age    Examination-Activity Limitations Stand;Locomotion Level;Stairs    Examination-Participation Restrictions Occupation;Community Activity    Stability/Clinical Decision Making Stable/Uncomplicated    Clinical Decision Making Low    Rehab Potential Good    PT Frequency 2x / week    PT Duration 6 weeks    PT Treatment/Interventions Aquatic Therapy;Cryotherapy;Electrical Stimulation;Iontophoresis 4mg /ml Dexamethasone;Moist Heat;Therapeutic exercise;Balance training;Neuromuscular re-education;Gait training;Stair training;Therapeutic activities;Patient/family education;Manual techniques;Dry needling;Taping;Vasopneumatic Device;Passive range of motion    PT Next Visit Plan assess HEP, progress ROM of knees and ankles, jt mobs for Lt foot flexibility    PT Home Exercise Plan [email or web address]    Consulted and Agree with Plan of Care Patient             Patient will benefit from skilled therapeutic intervention in order to improve the following deficits and impairments:  Impaired flexibility, Decreased strength, Decreased activity tolerance, Decreased range of motion, Increased edema, Hypomobility, Difficulty walking, Decreased balance  Visit Diagnosis: Other symptoms and signs involving the musculoskeletal system - Plan: PT plan of care cert/re-cert  Stiffness of left ankle, not elsewhere classified - Plan: PT plan of care cert/re-cert  Stiffness of left knee, not elsewhere classified - Plan: PT plan of care cert/re-cert  Difficulty in walking, not elsewhere classified - Plan: PT plan of care cert/re-cert  Localized edema - Plan: PT plan of care cert/re-cert     Problem List Patient Active Problem List   Diagnosis Date Noted   Wrist sprain, left, initial encounter 07/03/2020   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   History of total knee replacement, 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   [place] cardiac risk >20% in next 10 years 05/16/2019   History  of cancer of vulva 04/13/2019   Closed fracture of distal end of humerus, unspecified fracture morphology, initial encounter 06/22/2018   Closed nondisplaced fracture of head of right radius 06/22/2018   Trigger middle finger of left hand 09/14/2017   Morbid obesity due to excess calories ([place]) 01/26/2017   Sedentary lifestyle 01/26/2017   DDD (degenerative disc disease), lumbar 11/11/2016   Chronic right-sided low back pain with right-sided sciatica 11/09/2016   Aromatase inhibitor use 09/22/2016   At risk for breast cancer 09/22/2016   Therapeutic drug monitoring 09/22/2016   Mixed hyperlipidemia 08/02/2016   Type 2 diabetes mellitus without complication, without long-term current use of insulin ([place]) 07/26/2016   Colon polyps 10/04/2014   Essential hypertension 10/04/2014   History of gastric ulcer 10/04/2014   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

## 2021-08-27 ENCOUNTER — Encounter: Payer: Medicare Other | Admitting: Physical Therapy

## 2021-08-27 ENCOUNTER — Ambulatory Visit: Payer: Medicare Other | Admitting: Physical Therapy

## 2021-08-27 ENCOUNTER — Encounter: Payer: Self-pay | Admitting: Physical Therapy

## 2021-08-27 ENCOUNTER — Other Ambulatory Visit: Payer: Self-pay

## 2021-08-27 DIAGNOSIS — M25662 Stiffness of left knee, not elsewhere classified: Secondary | ICD-10-CM

## 2021-08-27 DIAGNOSIS — R600 Localized edema: Secondary | ICD-10-CM

## 2021-08-27 DIAGNOSIS — M25672 Stiffness of left ankle, not elsewhere classified: Secondary | ICD-10-CM

## 2021-08-27 DIAGNOSIS — R262 Difficulty in walking, not elsewhere classified: Secondary | ICD-10-CM

## 2021-08-27 DIAGNOSIS — R29898 Other symptoms and signs involving the musculoskeletal system: Secondary | ICD-10-CM

## 2021-08-27 NOTE — Patient Instructions (Signed)
Access Code: [email or web address] URL: [email or web address] Date: 08/27/2021 Prepared by: [place]  Exercises Gastroc Stretch on Wall - 1 x daily - 7 x weekly - 3 sets - 1 reps - 20-30 sec hold Soleus Stretch on Wall - 1 x daily - 7 x weekly - 3 sets - 1 reps - 20-30 sec hold Supine Heel Slide with Strap - 1 x daily - 7 x weekly - 1 sets - 10 reps - 10 seconds hold Towel Scrunches - 1 x daily - 7 x weekly - 3 sets - 1 reps - 1 min hold Standing Single Leg Stance with Counter Support - 1 x daily - 7 x weekly - 3 sets - 1 reps - 10-15 seconds hold Active Straight Leg Raise with Quad Set - 1 x daily - 7 x weekly - 2 sets - 10 reps Prone Quadriceps Stretch with Strap - 2 x daily - 7 x weekly - 1 sets - 3 reps - 20 seconds hold

## 2021-08-27 NOTE — Therapy (Signed)
[place], [place], [address] Phone: [phone]   Fax:  [phone]  Physical Therapy Treatment  Patient Details  Name: Marilea Gwynne Saye MRN: [id number] Date of Birth: [birth year] Referring Provider (PT): Hardie Pulley   Encounter Date: 08/27/2021   PT End of Session - 08/27/21 1527     Visit Number 2    Number of Visits 12    Date for PT Re-Evaluation 10/02/21    Authorization Type UHC medicare    Authorization - Visit Number 2    Progress Note Due on Visit 10    PT Start Time 0973    PT Stop Time 1527    PT Time Calculation (min) 45 min    Activity Tolerance Patient tolerated treatment well    Behavior During Therapy [facility] for tasks assessed/performed             History reviewed. No pertinent past medical history.  History reviewed. No pertinent surgical history.  There were no vitals filed for this visit.   Subjective Assessment - 08/27/21 1435     Subjective Pt reports she has been doing the exercises, "they're doing good".   "the flexibility is improving".    Pertinent History bilat knee replacements, bilat bunionectomy    Limitations Standing;Walking    How long can you stand comfortably? less than 5 minutes    How long can you walk comfortably? 10-15 minutes    Patient Stated Goals improve knee and ankle range of motion and strength    Currently in Pain? Yes    Pain Score 5     Pain Location Knee    Pain Orientation Left    Pain Descriptors / Indicators Sharp    Aggravating Factors  bending, walking    Pain Relieving Factors laying down not moving, with LE elevated.                [facility] PT Assessment - 08/27/21 0001       Assessment   Medical Diagnosis post op bunionectomy, Left hallux valgus    Referring Provider (PT) Hardie Pulley    Onset Date/Surgical Date 05/14/21    Hand Dominance Right    Next MD Visit 6 weeks      Flexibility   Soft Tissue Assessment  /Muscle Length yes    Quadriceps Lt knee 65-70, Rt knee 22              [facility] Adult PT Treatment/Exercise - 08/27/21 0001       Self-Care   Self-Care Other Self-Care Comments    Other Self-Care Comments  Pt instructed in self massage with roller stick to thighs and calves; pt returned demo with cues.      Knee/Hip Exercises: Stretches   Passive Hamstring Stretch Right;Left;2 reps;20 seconds    Quad Stretch Left;3 reps;Right;2 reps;20 seconds    Piriformis Stretch Right;Left;20 seconds   supine; Travell with strap assist   Gastroc Stretch Right;Left;2 reps;20 seconds   runners stretch   Soleus Stretch Right;Left;2 reps;20 seconds   standing     Knee/Hip Exercises: Aerobic   Nustep L5: arms/legs x 5 min for warm up      Knee/Hip Exercises: Supine   Heel Slides AAROM;Left;1 set;5 reps   strap assist   Straight Leg Raises Left;Right;1 set;10 reps      Manual Therapy   Manual therapy comments I strip of reg rock tape applied to Lt great toe incision and wrapped around  DIP to decompress, assist with ededema reduction and increase proprioception      Ankle Exercises: Seated   Ankle Circles/Pumps AROM;Right;Left;5 reps    Towel Crunch 2 reps              PT Education - 08/27/21 1606     Education Details HEP, extra strips of rock tape.  info given on safe tape removal.    Person(s) Educated Patient    Methods Explanation;Handout;Verbal cues                 PT Long Term Goals - 08/21/21 1130       PT LONG TERM GOAL #1   Title Pt will be independent with HEP    Time 6    Period Weeks    Status New    Target Date 10/02/21      PT LONG TERM GOAL #2   Title Pt will improve Lt ankle ROM by 10 degrees in DF and PF to improve gait and standing tolerance    Time 6    Period Weeks    Status New    Target Date 10/02/21      PT LONG TERM GOAL #3   Title Pt will improve balance to perform SLS x 10 seconds on bilat LE to demo reduced fall risk    Time 6     Period Weeks    Status New    Target Date 10/02/21      PT LONG TERM GOAL #4   Title Pt will improve LT knee flexion ROM by 10 degrees to improve gait tolerance    Time 6    Period Weeks    Status New    Target Date 10/02/21                   Plan - 08/27/21 1601     Clinical Impression Statement Pt with limited tolerance for lifting single foot up behind opp leg due to cramping in hamstring; improved tolerance with leg lifted with hip flexion.  Pt reported reduction of Lt knee pain after completion of prone quad stretch.  Trial of reg rock tape applied to Lt toe over incsion for decompression of tissue and increased proprioception.  Goals are ongoing.    Personal Factors and Comorbidities Profession;Comorbidity 2;Age    Examination-Activity Limitations Stand;Locomotion Level;Stairs    Examination-Participation Restrictions Occupation;Community Activity    Stability/Clinical Decision Making Stable/Uncomplicated    Rehab Potential Good    PT Frequency 2x / week    PT Duration 6 weeks    PT Treatment/Interventions Aquatic Therapy;Cryotherapy;Electrical Stimulation;Iontophoresis 4mg /ml Dexamethasone;Moist Heat;Therapeutic exercise;Balance training;Neuromuscular re-education;Gait training;Stair training;Therapeutic activities;Patient/family education;Manual techniques;Dry needling;Taping;Vasopneumatic Device;Passive range of motion    PT Next Visit Plan assess HEP, progress ROM of knees and ankles, jt mobs for Lt foot flexibility    PT Home Exercise Plan [email or web address]    Consulted and Agree with Plan of Care Patient             Patient will benefit from skilled therapeutic intervention in order to improve the following deficits and impairments:  Impaired flexibility, Decreased strength, Decreased activity tolerance, Decreased range of motion, Increased edema, Hypomobility, Difficulty walking, Decreased balance  Visit Diagnosis: Other symptoms and signs involving the  musculoskeletal system  Stiffness of left ankle, not elsewhere classified  Stiffness of left knee, not elsewhere classified  Difficulty in walking, not elsewhere classified  Localized edema     Problem List Patient Active Problem List  Diagnosis Date Noted   Wrist sprain, left, initial encounter 07/03/2020   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   History of total knee replacement, 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   [place] cardiac risk >20% in next 10 years 05/16/2019   History of cancer of vulva 04/13/2019   Closed fracture of distal end of humerus, unspecified fracture morphology, initial encounter 06/22/2018   Closed nondisplaced fracture of head of right radius 06/22/2018   Trigger middle finger of left hand 09/14/2017   Morbid obesity due to excess calories ([place]) 01/26/2017   Sedentary lifestyle 01/26/2017   DDD (degenerative disc disease), lumbar 11/11/2016   Chronic right-sided low back pain with right-sided sciatica 11/09/2016   Aromatase inhibitor use 09/22/2016   At risk for breast cancer 09/22/2016   Therapeutic drug monitoring 09/22/2016   Mixed hyperlipidemia 08/02/2016   Type 2 diabetes mellitus without complication, without long-term current use of insulin ([place]) 07/26/2016   Colon polyps 10/04/2014   Essential hypertension 10/04/2014   History of gastric ulcer 10/04/2014   Malignant neoplasm of lower-inner quadrant of left female breast ([place]) 10/03/2014   Kerin Perna, PTA 08/27/21 6:10 PM  [place] [address] [place], [place], [address] Phone: [phone]   Fax:  [phone]  Name: Eliot Bencivenga Widger MRN: [id number] Date of Birth: [birth year]

## 2021-08-28 ENCOUNTER — Encounter: Payer: Medicare Other | Admitting: Physical Therapy

## 2021-09-02 ENCOUNTER — Telehealth: Payer: Self-pay | Admitting: Physical Therapy

## 2021-09-02 ENCOUNTER — Encounter: Payer: Medicare Other | Admitting: Physical Therapy

## 2021-09-02 NOTE — Telephone Encounter (Signed)
Pt did not show for appointment. PT called but there was no answer. Unable to confirm upcoming appointment

## 2021-09-04 ENCOUNTER — Other Ambulatory Visit: Payer: Self-pay

## 2021-09-04 ENCOUNTER — Ambulatory Visit (INDEPENDENT_AMBULATORY_CARE_PROVIDER_SITE_OTHER): Payer: Medicare Other | Admitting: Physical Therapy

## 2021-09-04 DIAGNOSIS — M25672 Stiffness of left ankle, not elsewhere classified: Secondary | ICD-10-CM

## 2021-09-04 DIAGNOSIS — R29898 Other symptoms and signs involving the musculoskeletal system: Secondary | ICD-10-CM | POA: Diagnosis not present

## 2021-09-04 DIAGNOSIS — M25662 Stiffness of left knee, not elsewhere classified: Secondary | ICD-10-CM | POA: Diagnosis not present

## 2021-09-04 DIAGNOSIS — R600 Localized edema: Secondary | ICD-10-CM

## 2021-09-04 DIAGNOSIS — R262 Difficulty in walking, not elsewhere classified: Secondary | ICD-10-CM

## 2021-09-04 NOTE — Therapy (Signed)
[place], [place], [address] Phone: [phone]   Fax:  [phone]  Physical Therapy Treatment  Patient Details  Name: Angie [name] MRN: [id number] Date of Birth: [birth year] Referring Provider (PT): Hardie Pulley   Encounter Date: 09/04/2021   PT End of Session - 09/04/21 1449     Visit Number 3    Number of Visits 12    Date for PT Re-Evaluation 10/02/21    Authorization Type UHC medicare    Authorization - Visit Number 3    Progress Note Due on Visit 10    PT Start Time 1400    PT Stop Time 1443    PT Time Calculation (min) 43 min    Activity Tolerance Patient tolerated treatment well    Behavior During Therapy [facility] for tasks assessed/performed             No past medical history on file.  No past surgical history on file.  There were no vitals filed for this visit.   Subjective Assessment - 09/04/21 1405     Subjective Pt states she is "achey" today due to the weather    Patient Stated Goals improve knee and ankle range of motion and strength    Currently in Pain? Yes    Pain Score 5     Pain Location Knee    Pain Orientation Left    Pain Descriptors / Indicators Aching                [facility] PT Assessment - 09/04/21 0001       Assessment   Medical Diagnosis post op bunionectomy, Left hallux valgus    Referring Provider (PT) Hardie Pulley    Onset Date/Surgical Date 05/14/21    Hand Dominance Right    Next MD Visit 6 weeks      AROM   Left Knee Flexion 90                           [facility] Adult PT Treatment/Exercise - 09/04/21 0001       Knee/Hip Exercises: Stretches   Passive Hamstring Stretch Right;Left;2 reps;20 seconds    Piriformis Stretch Right;Left;20 seconds   supine; Travell with strap assist   Gastroc Stretch Right;Left;2 reps;20 seconds   runners stretch   Soleus Stretch Right;Left;2 reps;20 seconds   standing     Knee/Hip  Exercises: Aerobic   Nustep L5: arms/legs x 5 min for warm up      Knee/Hip Exercises: Supine   Heel Slides AAROM;Left;10 reps    Straight Leg Raises Right;Left;2 sets;10 reps      Manual Therapy   Joint Mobilization AP grade 2-3 mobs to improve DF, grade 2-3 jt mobs to improve midfoot mobility      Ankle Exercises: Seated   Ankle Circles/Pumps AROM;Both;10 reps    Towel Crunch 2 reps    BAPS Level 2;Sitting;10 reps   CW/CCW with assist   Other Seated Ankle Exercises rocker board A/P 2 x 1 min                          PT Long Term Goals - 08/21/21 1130       PT LONG TERM GOAL #1   Title Pt will be independent with HEP    Time 6    Period Weeks    Status New    Target  Date 10/02/21      PT LONG TERM GOAL #2   Title Pt will improve Lt ankle ROM by 10 degrees in DF and PF to improve gait and standing tolerance    Time 6    Period Weeks    Status New    Target Date 10/02/21      PT LONG TERM GOAL #3   Title Pt will improve balance to perform SLS x 10 seconds on bilat LE to demo reduced fall risk    Time 6    Period Weeks    Status New    Target Date 10/02/21      PT LONG TERM GOAL #4   Title Pt will improve LT knee flexion ROM by 10 degrees to improve gait tolerance    Time 6    Period Weeks    Status New    Target Date 10/02/21                   Plan - 09/04/21 1450     Clinical Impression Statement Pt with decreased Lt ankle coordination requiring assistance with [place] board. improving tolerance to SLR and heel slides.    PT Next Visit Plan progress HEP, strength and ROM knees and ankles    PT Home Exercise Plan [email or web address]    Consulted and Agree with Plan of Care Patient             Patient will benefit from skilled therapeutic intervention in order to improve the following deficits and impairments:     Visit Diagnosis: Other symptoms and signs involving the musculoskeletal system  Stiffness of left ankle, not elsewhere  classified  Stiffness of left knee, not elsewhere classified  Difficulty in walking, not elsewhere classified  Localized edema     Problem List Patient Active Problem List   Diagnosis Date Noted   Wrist sprain, left, initial encounter 07/03/2020   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   History of total knee replacement, 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   [place] cardiac risk >20% in next 10 years 05/16/2019   History of cancer of vulva 04/13/2019   Closed fracture of distal end of humerus, unspecified fracture morphology, initial encounter 06/22/2018   Closed nondisplaced fracture of head of right radius 06/22/2018   Trigger middle finger of left hand 09/14/2017   Morbid obesity due to excess calories ([place]) 01/26/2017   Sedentary lifestyle 01/26/2017   DDD (degenerative disc disease), lumbar 11/11/2016   Chronic right-sided low back pain with right-sided sciatica 11/09/2016   Aromatase inhibitor use 09/22/2016   At risk for breast cancer 09/22/2016   Therapeutic drug monitoring 09/22/2016   Mixed hyperlipidemia 08/02/2016   Type 2 diabetes mellitus without complication, without long-term current use of insulin ([place]) 07/26/2016   Colon polyps 10/04/2014   Essential hypertension 10/04/2014   History of gastric ulcer 10/04/2014   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

## 2021-09-09 ENCOUNTER — Other Ambulatory Visit: Payer: Self-pay

## 2021-09-09 ENCOUNTER — Ambulatory Visit: Payer: Medicare Other | Admitting: Physical Therapy

## 2021-09-09 DIAGNOSIS — R600 Localized edema: Secondary | ICD-10-CM

## 2021-09-09 DIAGNOSIS — M25672 Stiffness of left ankle, not elsewhere classified: Secondary | ICD-10-CM | POA: Diagnosis not present

## 2021-09-09 DIAGNOSIS — R29898 Other symptoms and signs involving the musculoskeletal system: Secondary | ICD-10-CM

## 2021-09-09 DIAGNOSIS — M25662 Stiffness of left knee, not elsewhere classified: Secondary | ICD-10-CM

## 2021-09-09 DIAGNOSIS — R262 Difficulty in walking, not elsewhere classified: Secondary | ICD-10-CM

## 2021-09-09 NOTE — Therapy (Signed)
[place], [place], [address] Phone: [phone]   Fax:  [phone]  Physical Therapy Treatment  Patient Details  Name: Angie [name] MRN: [id number] Date of Birth: Aug 06, 1946 Referring Provider (PT): Hardie Pulley   Encounter Date: 09/09/2021   PT End of Session - 09/09/21 1514     Visit Number 4    Number of Visits 12    Date for PT Re-Evaluation 10/02/21    Authorization Type UHC medicare    Authorization - Visit Number 4    Progress Note Due on Visit 10    PT Start Time 1430    PT Stop Time 1512    PT Time Calculation (min) 42 min    Activity Tolerance Patient tolerated treatment well    Behavior During Therapy [facility] for tasks assessed/performed             No past medical history on file.  No past surgical history on file.  There were no vitals filed for this visit.   Subjective Assessment - 09/09/21 1432     Subjective Pt states "today is a good day"    Patient Stated Goals improve knee and ankle range of motion and strength    Currently in Pain? No/denies                [facility] PT Assessment - 09/09/21 0001       Assessment   Medical Diagnosis post op bunionectomy, Left hallux valgus    Referring Provider (PT) Hardie Pulley    Onset Date/Surgical Date 05/14/21    Hand Dominance Right    Next MD Visit 6 weeks      AROM   Right Ankle Dorsiflexion 2    Left Ankle Dorsiflexion 2                           [facility] Adult PT Treatment/Exercise - 09/09/21 0001       Knee/Hip Exercises: Stretches   Passive Hamstring Stretch Right;Left;2 reps;20 seconds    Quad Stretch Right;Left;2 reps;30 seconds    Piriformis Stretch Right;Left;20 seconds   supine; Travell with strap assist   Gastroc Stretch Right;Left;2 reps;20 seconds   runners stretch   Soleus Stretch Right;Left;2 reps;20 seconds   standing     Knee/Hip Exercises: Aerobic   Nustep L5: arms/legs  x 5 min for warm up      Knee/Hip Exercises: Machines for Strengthening   Total Gym Leg Press 5 plates x 20      Knee/Hip Exercises: Supine   Straight Leg Raises Right;Left;2 sets;10 reps      Manual Therapy   Joint Mobilization AP grade 2-3 mobs to improve DF, grade 2-3 jt mobs to improve midfoot mobility      Ankle Exercises: Seated   Ankle Circles/Pumps AROM;Both;10 reps    BAPS Level 2;Sitting;10 reps   CW/CCW with assist   Other Seated Ankle Exercises rocker board A/P 2 x 1 min                          PT Long Term Goals - 08/21/21 1130       PT LONG TERM GOAL #1   Title Pt will be independent with HEP    Time 6    Period Weeks    Status New    Target Date 10/02/21      PT LONG  TERM GOAL #2   Title Pt will improve Lt ankle ROM by 10 degrees in DF and PF to improve gait and standing tolerance    Time 6    Period Weeks    Status New    Target Date 10/02/21      PT LONG TERM GOAL #3   Title Pt will improve balance to perform SLS x 10 seconds on bilat LE to demo reduced fall risk    Time 6    Period Weeks    Status New    Target Date 10/02/21      PT LONG TERM GOAL #4   Title Pt will improve LT knee flexion ROM by 10 degrees to improve gait tolerance    Time 6    Period Weeks    Status New    Target Date 10/02/21                   Plan - 09/09/21 1515     Clinical Impression Statement Pt continues with decreased ankle coordination but improving ROM and activity tolerance. Good tolerance to addition of leg press today    PT Next Visit Plan update HEP, ROM and strength in bilat knees and ankles, standing exercises    PT Home Exercise Plan [email or web address]    Consulted and Agree with Plan of Care Patient             Patient will benefit from skilled therapeutic intervention in order to improve the following deficits and impairments:     Visit Diagnosis: Difficulty in walking, not elsewhere classified  Stiffness of left knee, not  elsewhere classified  Stiffness of left ankle, not elsewhere classified  Other symptoms and signs involving the musculoskeletal system  Localized edema     Problem List Patient Active Problem List   Diagnosis Date Noted   Wrist sprain, left, initial encounter 07/03/2020   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   History of total knee replacement, 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   [place] cardiac risk >20% in next 10 years 05/16/2019   History of cancer of vulva 04/13/2019   Closed fracture of distal end of humerus, unspecified fracture morphology, initial encounter 06/22/2018   Closed nondisplaced fracture of head of right radius 06/22/2018   Trigger middle finger of left hand 09/14/2017   Morbid obesity due to excess calories ([place]) 01/26/2017   Sedentary lifestyle 01/26/2017   DDD (degenerative disc disease), lumbar 11/11/2016   Chronic right-sided low back pain with right-sided sciatica 11/09/2016   Aromatase inhibitor use 09/22/2016   At risk for breast cancer 09/22/2016   Therapeutic drug monitoring 09/22/2016   Mixed hyperlipidemia 08/02/2016   Type 2 diabetes mellitus without complication, without long-term current use of insulin ([place]) 07/26/2016   Colon polyps 10/04/2014   Essential hypertension 10/04/2014   History of gastric ulcer 10/04/2014   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

## 2021-09-11 ENCOUNTER — Encounter: Payer: Self-pay | Admitting: Physical Therapy

## 2021-09-11 ENCOUNTER — Other Ambulatory Visit: Payer: Self-pay

## 2021-09-11 ENCOUNTER — Ambulatory Visit: Payer: Medicare Other | Admitting: Physical Therapy

## 2021-09-11 DIAGNOSIS — R262 Difficulty in walking, not elsewhere classified: Secondary | ICD-10-CM

## 2021-09-11 DIAGNOSIS — M25672 Stiffness of left ankle, not elsewhere classified: Secondary | ICD-10-CM

## 2021-09-11 DIAGNOSIS — M25662 Stiffness of left knee, not elsewhere classified: Secondary | ICD-10-CM | POA: Diagnosis not present

## 2021-09-11 DIAGNOSIS — R29898 Other symptoms and signs involving the musculoskeletal system: Secondary | ICD-10-CM

## 2021-09-11 NOTE — Therapy (Signed)
[place]  [place], [place], [address] Phone: [phone]   Fax:  [phone]  Physical Therapy Treatment  Patient Details  Name: Angie [name] MRN: [id number] Date of Birth: 10-Jan-1946 Referring Provider (PT): Hardie Pulley   Encounter Date: 09/11/2021   PT End of Session - 09/11/21 1021     Visit Number 5    Number of Visits 12    Date for PT Re-Evaluation 10/02/21    Authorization Type UHC medicare    Authorization - Visit Number 5    Progress Note Due on Visit 10    PT Start Time 1016    PT Stop Time 1056    PT Time Calculation (min) 40 min    Activity Tolerance Patient tolerated treatment well    Behavior During Therapy [facility] for tasks assessed/performed             History reviewed. No pertinent past medical history.  History reviewed. No pertinent surgical history.  There were no vitals filed for this visit.   Subjective Assessment - 09/11/21 1018     Subjective Pt reports she is doing much better since starting therapy.  She has been using rolling pin to help relax her LEs.    Patient Stated Goals improve knee and ankle range of motion and strength    Currently in Pain? No/denies    Pain Score 0-No pain                [facility] PT Assessment - 09/11/21 0001       Assessment   Medical Diagnosis post op bunionectomy, Left hallux valgus    Referring Provider (PT) Hardie Pulley    Onset Date/Surgical Date 05/14/21    Hand Dominance Right    Next MD Visit 09/22/21      Single Leg Stance   Comments RLE 3 sec, LLE 1 sec      Flexibility   Quadriceps Lt knee 70, Rt knee 90              [facility] Adult PT Treatment/Exercise - 09/11/21 0001       Knee/Hip Exercises: Stretches   Passive Hamstring Stretch Right;Left;1 rep;20 seconds   seated, hip hinge   Quad Stretch Right;Left;3 reps;20 seconds   prone with strap   Gastroc Stretch Right;Left;2 reps;20 seconds   runners  stretch   Soleus Stretch Right;Left;2 reps;20 seconds   standing     Knee/Hip Exercises: Aerobic   Nustep L5: arms/legs x 5 min for warm up      Knee/Hip Exercises: Machines for Strengthening   Total Gym Leg Press 5 plates x 20   seat#6     Knee/Hip Exercises: Standing   SLS 10 sec each leg, 2 reps, with intermittent support      Manual Therapy   Manual Therapy Passive ROM;Taping;Joint mobilization    Manual therapy comments I strip of reg rock tape applied to Lt great toe incision and wrapped around DIP. additional piece of Lt lateral malleolus  to decompress, assist with edema reduction and increase proprioception    Joint Mobilization AP grade 2-3 mobs to Lt  talocrural joint to improve DF    Passive ROM PROM of Lt toes (PF/DF)      Ankle Exercises: Seated   Other Seated Ankle Exercises rocker board A/P and lateral 2 x 1 min              PT Long Term Goals - 08/21/21 1130  PT LONG TERM GOAL #1   Title Pt will be independent with HEP    Time 6    Period Weeks    Status New    Target Date 10/02/21      PT LONG TERM GOAL #2   Title Pt will improve Lt ankle ROM by 10 degrees in DF and PF to improve gait and standing tolerance    Time 6    Period Weeks    Status New    Target Date 10/02/21      PT LONG TERM GOAL #3   Title Pt will improve balance to perform SLS x 10 seconds on bilat LE to demo reduced fall risk    Time 6    Period Weeks    Status New    Target Date 10/02/21      PT LONG TERM GOAL #4   Title Pt will improve LT knee flexion ROM by 10 degrees to improve gait tolerance    Time 6    Period Weeks    Status New    Target Date 10/02/21                   Plan - 09/11/21 1100     Clinical Impression Statement Decreased balance in unsupported SLS (1-3 sec each leg); encouraged pt to work on SLS at counter at home.  Quads remain unchanged from last flexibility assessment.  Good tolerance to LE exercises today.  Pt making gradual progress  towards LTGs.    PT Next Visit Plan update HEP, MD note.    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   History of total knee replacement, 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   [place] cardiac risk >20% in next 10 years 05/16/2019   History of cancer of vulva 04/13/2019   Closed fracture of distal end of humerus, unspecified fracture morphology, initial encounter 06/22/2018   Closed nondisplaced fracture of head of right radius 06/22/2018   Trigger middle finger of left hand 09/14/2017   Morbid obesity due to excess calories ([place]) 01/26/2017   Sedentary lifestyle 01/26/2017   DDD (degenerative disc disease), lumbar 11/11/2016   Chronic right-sided low back pain with right-sided sciatica 11/09/2016   Aromatase inhibitor use 09/22/2016   At risk for breast cancer 09/22/2016   Therapeutic drug monitoring 09/22/2016   Mixed hyperlipidemia 08/02/2016   Type 2 diabetes mellitus without complication, without long-term current use of insulin ([place]) 07/26/2016   Colon polyps 10/04/2014   Essential hypertension 10/04/2014   History of gastric ulcer 10/04/2014   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 [place], [place], [address] Phone: [phone]   Fax:   [phone]  Name: Angie [name] MRN: [id number] Date of Birth: [birth year]

## 2021-09-14 ENCOUNTER — Other Ambulatory Visit: Payer: Self-pay

## 2021-09-14 ENCOUNTER — Ambulatory Visit (INDEPENDENT_AMBULATORY_CARE_PROVIDER_SITE_OTHER): Payer: Medicare Other | Admitting: Physical Therapy

## 2021-09-14 DIAGNOSIS — R29898 Other symptoms and signs involving the musculoskeletal system: Secondary | ICD-10-CM | POA: Diagnosis not present

## 2021-09-14 DIAGNOSIS — M25672 Stiffness of left ankle, not elsewhere classified: Secondary | ICD-10-CM

## 2021-09-14 DIAGNOSIS — M25662 Stiffness of left knee, not elsewhere classified: Secondary | ICD-10-CM

## 2021-09-14 DIAGNOSIS — R262 Difficulty in walking, not elsewhere classified: Secondary | ICD-10-CM | POA: Diagnosis not present

## 2021-09-14 DIAGNOSIS — R600 Localized edema: Secondary | ICD-10-CM

## 2021-09-14 NOTE — Therapy (Signed)
[place], [place], [address] Phone: [phone]   Fax:  [phone]  Physical Therapy Treatment  Patient Details  Name: Angie [name] MRN: [id number] Date of Birth: Mar 18, 1946 Referring Provider (PT): Hardie Pulley   Encounter Date: 09/14/2021   PT End of Session - 09/14/21 0941     Visit Number 6    Number of Visits 12    Date for PT Re-Evaluation 10/02/21    Authorization Type UHC medicare    Authorization - Visit Number 6    Progress Note Due on Visit 10    PT Start Time 0900    PT Stop Time 0938    PT Time Calculation (min) 38 min    Activity Tolerance Patient tolerated treatment well    Behavior During Therapy [facility] for tasks assessed/performed             No past medical history on file.  No past surgical history on file.  There were no vitals filed for this visit.   Subjective Assessment - 09/14/21 0904     Subjective Pt reports she is "coming along". She states she is feeling much better    Patient Stated Goals improve knee and ankle range of motion and strength    Currently in Pain? No/denies                [facility] PT Assessment - 09/14/21 0001       Assessment   Medical Diagnosis post op bunionectomy, Left hallux valgus    Referring Provider (PT) Hardie Pulley    Onset Date/Surgical Date 05/14/21    Hand Dominance Right    Next MD Visit 09/22/21      AROM   Left Knee Flexion 90    Right Ankle Dorsiflexion 5    Right Ankle Plantar Flexion 38    Right Ankle Inversion 16    Right Ankle Eversion 32    Left Ankle Dorsiflexion 3    Left Ankle Plantar Flexion 34    Left Ankle Inversion 33    Left Ankle Eversion 18                           [facility] Adult PT Treatment/Exercise - 09/14/21 0001       Knee/Hip Exercises: Stretches   Sports administrator Left;2 reps;30 seconds    Gastroc Stretch Right;Left;2 reps;20 seconds   runners stretch   Soleus  Stretch Right;Left;2 reps;20 seconds   standing     Knee/Hip Exercises: Aerobic   Nustep L5: arms/legs x 5 min for warm up      Knee/Hip Exercises: Machines for Strengthening   Total Gym Leg Press 5 plates x 20   seat#6     Knee/Hip Exercises: Standing   Forward Step Up Right;Left;1 set;Hand Hold: 0;Step Height: 4";10 reps    Step Down Right;Left;1 set;10 reps;Step Height: 4";Hand Hold: 1    SLS x 30 sec bilat wiht intermittent UE support      Manual Therapy   Joint Mobilization AP grade 2-3 mobs to Lt  talocrural joint to improve DF    Passive ROM PROM of Lt toes (PF/DF)      Ankle Exercises: Seated   Other Seated Ankle Exercises rocker board A/P 2 x 1 min                          PT Long  Term Goals - 09/14/21 0941       PT LONG TERM GOAL #1   Title Pt will be independent with HEP    Status On-going      PT LONG TERM GOAL #2   Title Pt will improve Lt ankle ROM by 10 degrees in DF and PF to improve gait and standing tolerance    Status On-going      PT LONG TERM GOAL #3   Title Pt will improve balance to perform SLS x 10 seconds on bilat LE to demo reduced fall risk    Status Achieved      PT LONG TERM GOAL #4   Title Pt will improve LT knee flexion ROM by 10 degrees to improve gait tolerance    Status On-going                   Plan - 09/14/21 0942     Clinical Impression Statement Pt has improved ankle ROM in all planes and has improved SLS to be able to peform x 10 bilat today. Pt continues with decreased Lt knee ROM and Lt knee pain with eccentric exercises. Pt is making progress towards LTGs    PT Next Visit Plan update HEP, Lt knee ROM, standing ankle and knee strength and balance    PT Home Exercise Plan [email or web address]    Consulted and Agree with Plan of Care Patient             Patient will benefit from skilled therapeutic intervention in order to improve the following deficits and impairments:     Visit Diagnosis: Difficulty in  walking, not elsewhere classified  Stiffness of left knee, not elsewhere classified  Stiffness of left ankle, not elsewhere classified  Other symptoms and signs involving the musculoskeletal system  Localized edema     Problem List Patient Active Problem List   Diagnosis Date Noted   Wrist sprain, left, initial encounter 07/03/2020   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   History of total knee replacement, 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   [place] cardiac risk >20% in next 10 years 05/16/2019   History of cancer of vulva 04/13/2019   Closed fracture of distal end of humerus, unspecified fracture morphology, initial encounter 06/22/2018   Closed nondisplaced fracture of head of right radius 06/22/2018   Trigger middle finger of left hand 09/14/2017   Morbid obesity due to excess calories ([place]) 01/26/2017   Sedentary lifestyle 01/26/2017   DDD (degenerative disc disease), lumbar 11/11/2016   Chronic right-sided low back pain with right-sided sciatica 11/09/2016   Aromatase inhibitor use 09/22/2016   At risk for breast cancer 09/22/2016   Therapeutic drug monitoring 09/22/2016   Mixed hyperlipidemia 08/02/2016   Type 2 diabetes mellitus without complication, without long-term current use of insulin ([place]) 07/26/2016   Colon polyps 10/04/2014   Essential hypertension 10/04/2014   History of gastric ulcer 10/04/2014   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

## 2021-09-22 ENCOUNTER — Ambulatory Visit (INDEPENDENT_AMBULATORY_CARE_PROVIDER_SITE_OTHER): Payer: Medicare Other | Admitting: Podiatry

## 2021-09-22 ENCOUNTER — Other Ambulatory Visit: Payer: Self-pay

## 2021-09-22 DIAGNOSIS — M2012 Hallux valgus (acquired), left foot: Secondary | ICD-10-CM | POA: Diagnosis not present

## 2021-09-22 DIAGNOSIS — G8929 Other chronic pain: Secondary | ICD-10-CM

## 2021-09-22 DIAGNOSIS — M25562 Pain in left knee: Secondary | ICD-10-CM | POA: Diagnosis not present

## 2021-09-22 NOTE — Progress Notes (Signed)
  Subjective:  Patient ID: Angie [name], female    DOB: 1945-12-01,  MRN: [id number]  Chief Complaint  Patient presents with   Bunions    Patient states she still has some discomfort in the left foot but the swelling has decreased. Patient also stated she is going to PT and it seems to be helpful, working on increasing the ROM in Left hallux.    DOS: 05/13/21 Procedure: Liane Comber Bunionectomy left  75 y.o. female presents with the above complaint. History confirmed with patient.  Objective:  Physical Exam: no tenderness at the surgical site, local edema noted, and calf supple, nontender. Full 1st MPJ ROM Incision:well healed Assessment:   1. Hallux valgus, left   2. Chronic pain of left knee     Plan:  Patient was evaluated and treated and all questions answered.  Post-operative State -Better ROM, no pain on ROM. At this point will  -Continue normal shoegear  Left knee pain -Improving with PT  No follow-ups on file.

## 2021-09-23 ENCOUNTER — Ambulatory Visit: Payer: Medicare Other | Admitting: Physical Therapy

## 2021-09-23 ENCOUNTER — Encounter: Payer: Self-pay | Admitting: Physical Therapy

## 2021-09-23 DIAGNOSIS — R262 Difficulty in walking, not elsewhere classified: Secondary | ICD-10-CM

## 2021-09-23 DIAGNOSIS — R29898 Other symptoms and signs involving the musculoskeletal system: Secondary | ICD-10-CM | POA: Diagnosis not present

## 2021-09-23 DIAGNOSIS — M25672 Stiffness of left ankle, not elsewhere classified: Secondary | ICD-10-CM | POA: Diagnosis not present

## 2021-09-23 DIAGNOSIS — M25662 Stiffness of left knee, not elsewhere classified: Secondary | ICD-10-CM | POA: Diagnosis not present

## 2021-09-23 NOTE — Therapy (Signed)
[place], [place], [address] Phone: [phone]   Fax:  [phone]  Physical Therapy Treatment  Patient Details  Name: Angie [name] MRN: [id number] Date of Birth: 08-28-1946 Referring Provider (PT): Hardie Pulley   Encounter Date: 09/23/2021   PT End of Session - 09/23/21 1428     Visit Number 7    Number of Visits 12    Date for PT Re-Evaluation 10/02/21    Authorization Type UHC medicare    Authorization - Visit Number 7    Progress Note Due on Visit 10    PT Start Time 9163    PT Stop Time 1512    PT Time Calculation (min) 43 min    Activity Tolerance Patient tolerated treatment well    Behavior During Therapy [facility] for tasks assessed/performed             History reviewed. No pertinent past medical history.  History reviewed. No pertinent surgical history.  There were no vitals filed for this visit.   Subjective Assessment - 09/23/21 1429     Subjective Pt reports she'd like to focus on her flexibility of her Lt knee.  She states the doctor was pleased with her progress for her toe.    Pertinent History bilat knee replacements, bilat bunionectomy    Patient Stated Goals improve knee and ankle range of motion and strength    Currently in Pain? No/denies    Pain Score 0-No pain                [facility] PT Assessment - 09/23/21 0001       Assessment   Medical Diagnosis post op bunionectomy, Left hallux valgus    Referring Provider (PT) Hardie Pulley    Onset Date/Surgical Date 05/14/21    Hand Dominance Right    Next MD Visit PRN      AROM   Right Ankle Dorsiflexion 5    Left Ankle Dorsiflexion 3      Flexibility   Quadriceps Lt knee 57- first stretch, 71 - 5th stretch              [facility] Adult PT Treatment/Exercise - 09/23/21 0001       Knee/Hip Exercises: Stretches   Quad Stretch Left;20 seconds;5 reps;Right;2 reps   prone with strap   Hip Flexor  Stretch Left;2 reps;30 seconds   Thomas position on low black mat   Gastroc Stretch Right;Left;2 reps;20 seconds   runners stretch   Soleus Stretch Right;Left;2 reps;20 seconds   standing     Knee/Hip Exercises: Aerobic   Nustep L5: arms/legs x 5 min for warm up      Knee/Hip Exercises: Machines for Strengthening   Total Gym Leg Press 5 plates x 20   seat#6     Knee/Hip Exercises: Standing   Lateral Step Up Right;Left;1 set;5 reps;Hand Hold: 1;Step Height: 4"    Forward Step Up Right;Left;1 set;10 reps;Hand Hold: 2;Step Height: 6"    Other Standing Knee Exercises side to side lunge leaning on counter x 10 each direction      Knee/Hip Exercises: Supine   Bridges 1 set;10 reps              PT Long Term Goals - 09/23/21 1440       PT LONG TERM GOAL #1   Title Pt will be independent with HEP    Status On-going      PT LONG TERM GOAL #  2   Title Pt will improve Lt ankle ROM by 10 degrees in DF and PF to improve gait and standing tolerance    Status On-going      PT LONG TERM GOAL #3   Title Pt will improve balance to perform SLS x 10 seconds on bilat LE to demo reduced fall risk    Status Achieved      PT LONG TERM GOAL #4   Title Pt will improve LT knee flexion ROM by 10 degrees to improve gait tolerance    Status On-going                   Plan - 09/23/21 1706     Clinical Impression Statement Pt continues with decreased Lt knee strength and quad flexibility; no change in ankle ROM since last assessment. Encouraged continued compliance with daily stretches. .  She tolerated exercises well and remains motivated to progress towards LTGs.    PT Frequency 2x / week    PT Duration 6 weeks    PT Treatment/Interventions Aquatic Therapy;Cryotherapy;Electrical Stimulation;Iontophoresis 4mg /ml Dexamethasone;Moist Heat;Therapeutic exercise;Balance training;Neuromuscular re-education;Gait training;Stair training;Therapeutic activities;Patient/family education;Manual  techniques;Dry needling;Taping;Vasopneumatic Device;Passive range of motion    PT Next Visit Plan update HEP, Lt knee ROM, standing ankle and knee strength and balance -end of POC (recert)    PT Home Exercise Plan [email or web address]    Consulted and Agree with Plan of Care Patient             Patient will benefit from skilled therapeutic intervention in order to improve the following deficits and impairments:  Impaired flexibility, Decreased strength, Decreased activity tolerance, Decreased range of motion, Increased edema, Hypomobility, Difficulty walking, Decreased balance  Visit Diagnosis: Difficulty in walking, not elsewhere classified  Stiffness of left knee, not elsewhere classified  Stiffness of left ankle, not elsewhere classified  Other symptoms and signs involving the musculoskeletal system     Problem List Patient Active Problem List   Diagnosis Date Noted   Wrist sprain, left, initial encounter 07/03/2020   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   History of total knee replacement, 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   [place] cardiac risk >20% in next 10 years 05/16/2019   History of cancer of vulva 04/13/2019   Closed fracture of distal end of humerus, unspecified fracture morphology, initial encounter 06/22/2018   Closed nondisplaced fracture of head of right radius 06/22/2018   Trigger middle finger of left hand 09/14/2017   Morbid obesity due to excess calories ([place]) 01/26/2017   Sedentary lifestyle 01/26/2017   DDD (degenerative disc disease), lumbar 11/11/2016   Chronic right-sided low back pain with right-sided sciatica 11/09/2016   Aromatase inhibitor use 09/22/2016   At risk for breast cancer 09/22/2016   Therapeutic drug monitoring 09/22/2016   Mixed hyperlipidemia 08/02/2016   Type 2 diabetes mellitus without complication, without long-term current use of insulin ([place]) 07/26/2016   Colon polyps 10/04/2014   Essential hypertension 10/04/2014   History of gastric ulcer  10/04/2014   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 [place], [place], [address] Phone: [phone]   Fax:  [phone]  Name: Angie [name] MRN: [id number] Date of Birth: 1946-09-05

## 2021-09-30 ENCOUNTER — Encounter: Payer: Medicare Other | Admitting: Physical Therapy

## 2021-10-07 ENCOUNTER — Ambulatory Visit (INDEPENDENT_AMBULATORY_CARE_PROVIDER_SITE_OTHER): Payer: Medicare Other | Admitting: Physical Therapy

## 2021-10-07 ENCOUNTER — Other Ambulatory Visit: Payer: Self-pay

## 2021-10-07 DIAGNOSIS — M25662 Stiffness of left knee, not elsewhere classified: Secondary | ICD-10-CM

## 2021-10-07 DIAGNOSIS — R262 Difficulty in walking, not elsewhere classified: Secondary | ICD-10-CM

## 2021-10-07 NOTE — Therapy (Signed)
[place], [place], [address] Phone: [phone]   Fax:  [phone]  Physical Therapy Treatment and Recertification  Patient Details  Name: Angie [name] MRN: [id number] Date of Birth: 1945/11/27 Referring Provider (PT): Hardie Pulley   Encounter Date: 10/07/2021   PT End of Session - 10/07/21 1522     Visit Number 8    Number of Visits 14    Date for PT Re-Evaluation 11/18/21    Authorization Type UHC medicare    Authorization - Visit Number 8    Progress Note Due on Visit 10    PT Start Time 1430    PT Stop Time 1512    PT Time Calculation (min) 42 min    Activity Tolerance Patient tolerated treatment well    Behavior During Therapy [facility] for tasks assessed/performed             No past medical history on file.  No past surgical history on file.  There were no vitals filed for this visit.   Subjective Assessment - 10/07/21 1433     Subjective Pt states she has not had time to go to they [employer] due to working a lot. She feels more stiff    Patient Stated Goals improve knee and ankle range of motion and strength    Currently in Pain? Yes    Pain Score 4     Pain Location Knee    Pain Orientation Left    Pain Descriptors / Indicators Aching                [facility] PT Assessment - 10/07/21 0001       Assessment   Medical Diagnosis post op bunionectomy, Left hallux valgus    Referring Provider (PT) Hardie Pulley    Onset Date/Surgical Date 05/14/21    Hand Dominance Right    Next MD Visit PRN      AROM   Left Knee Flexion 16                           [facility] Adult PT Treatment/Exercise - 10/07/21 0001       Knee/Hip Exercises: Stretches   Passive Hamstring Stretch Right;Left;2 reps;30 seconds    Passive Hamstring Stretch Limitations standing    Quad Stretch Left;2 reps;30 seconds    Quad Stretch Limitations prone with strap    Gastroc Stretch  Right;Left;2 reps;20 seconds   runners stretch     Knee/Hip Exercises: Aerobic   Nustep L5: arms/legs x 5 min for warm up      Knee/Hip Exercises: Machines for Strengthening   Total Gym Leg Press 6 plates x 20   seat 7     Knee/Hip Exercises: Standing   Knee Flexion Right;Left;10 reps    Lateral Step Up Right;Left;1 set;10 reps;Hand Hold: 1;Step Height: 4"    Forward Step Up Right;Left;1 set;10 reps;Hand Hold: 2;Step Height: 6"    Other Standing Knee Exercises side to side lunge leaning on counter x 10 each direction      Knee/Hip Exercises: Supine   Bridges 10 reps      Manual Therapy   Manual therapy comments contract relax for LT knee flexion ROM    Passive ROM PROM Lt knee to improve flexion                          PT Long Term  Goals - 10/07/21 1454       PT LONG TERM GOAL #1   Title Pt will be independent with HEP    Status On-going    Target Date 11/18/21      PT LONG TERM GOAL #4   Title Pt will improve LT knee flexion ROM by 10 degrees to improve gait tolerance    Status On-going    Target Date 11/18/21                   Plan - 10/07/21 1524     Clinical Impression Statement Pt continues with decreased LT knee strength and ROM and will benefit from continued skilled PT to progress knee flexibilty and strength to meet remaining goals,    Personal Factors and Comorbidities Profession;Comorbidity 2;Age    Examination-Activity Limitations Stand;Locomotion Level;Stairs    Examination-Participation Restrictions Occupation;Community Activity    PT Frequency 1x / week    PT Duration 6 weeks    PT Treatment/Interventions Aquatic Therapy;Cryotherapy;Electrical Stimulation;Iontophoresis 4mg /ml Dexamethasone;Moist Heat;Therapeutic exercise;Balance training;Neuromuscular re-education;Gait training;Stair training;Therapeutic activities;Patient/family education;Manual techniques;Dry needling;Taping;Vasopneumatic Device;Passive range of motion    PT  Next Visit Plan update HEP, LT knee strength and ROM    PT Home Exercise Plan [email or web address]             Patient will benefit from skilled therapeutic intervention in order to improve the following deficits and impairments:  Impaired flexibility, Decreased strength, Decreased activity tolerance, Decreased range of motion, Increased edema, Hypomobility, Difficulty walking, Decreased balance  Visit Diagnosis: Difficulty in walking, not elsewhere classified - Plan: PT plan of care cert/re-cert  Stiffness of left knee, not elsewhere classified - Plan: PT plan of care cert/re-cert     Problem List Patient Active Problem List   Diagnosis Date Noted   Wrist sprain, left, initial encounter 07/03/2020   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   History of total knee replacement, 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   [place] cardiac risk >20% in next 10 years 05/16/2019   History of cancer of vulva 04/13/2019   Closed fracture of distal end of humerus, unspecified fracture morphology, initial encounter 06/22/2018   Closed nondisplaced fracture of head of right radius 06/22/2018   Trigger middle finger of left hand 09/14/2017   Morbid obesity due to excess calories ([place]) 01/26/2017   Sedentary lifestyle 01/26/2017   DDD (degenerative disc disease), lumbar 11/11/2016   Chronic right-sided low back pain with right-sided sciatica 11/09/2016   Aromatase inhibitor use 09/22/2016   At risk for breast cancer 09/22/2016   Therapeutic drug monitoring 09/22/2016   Mixed hyperlipidemia 08/02/2016   Type 2 diabetes mellitus without complication, without long-term current use of insulin ([place]) 07/26/2016   Colon polyps 10/04/2014   Essential hypertension 10/04/2014   History of gastric ulcer 10/04/2014   Malignant neoplasm of lower-inner quadrant of left female breast ([place]) 10/03/2014    Austen Oyster, PT 10/07/2021, 3:28 PM  [place] [address] [place], [place], [address] Phone: [phone]   Fax:  [phone]  Name: Angie [name] MRN: [id number] Date of Birth: 05/26/1946

## 2021-10-14 ENCOUNTER — Other Ambulatory Visit: Payer: Self-pay

## 2021-10-14 ENCOUNTER — Ambulatory Visit: Payer: Medicare Other | Admitting: Physical Therapy

## 2021-10-14 ENCOUNTER — Encounter: Payer: Self-pay | Admitting: Physical Therapy

## 2021-10-14 DIAGNOSIS — M25672 Stiffness of left ankle, not elsewhere classified: Secondary | ICD-10-CM | POA: Diagnosis not present

## 2021-10-14 DIAGNOSIS — R29898 Other symptoms and signs involving the musculoskeletal system: Secondary | ICD-10-CM | POA: Diagnosis not present

## 2021-10-14 DIAGNOSIS — R262 Difficulty in walking, not elsewhere classified: Secondary | ICD-10-CM

## 2021-10-14 DIAGNOSIS — M25662 Stiffness of left knee, not elsewhere classified: Secondary | ICD-10-CM | POA: Diagnosis not present

## 2021-10-14 NOTE — Therapy (Addendum)
[place], [place], [address] Phone: [phone]   Fax:  [phone]  Physical Therapy Treatment and Discharge  Patient Details  Name: Angie [name] MRN: [id number] Date of Birth: 08/18/1946 Referring Provider (PT): Hardie Pulley   Encounter Date: 10/14/2021   PT End of Session - 10/14/21 1428     Visit Number 9    Number of Visits 14    Date for PT Re-Evaluation 11/18/21    Authorization Type UHC medicare    Authorization - Visit Number 9    Progress Note Due on Visit 10    PT Start Time 5188    PT Stop Time 1505    PT Time Calculation (min) 40 min    Activity Tolerance Patient tolerated treatment well    Behavior During Therapy [facility] for tasks assessed/performed             History reviewed. No pertinent past medical history.  History reviewed. No pertinent surgical history.  There were no vitals filed for this visit.   Subjective Assessment - 10/14/21 1429     Subjective Pt reports she was released by Dr.  She is pleased with how her knee has been doing.    Patient Stated Goals improve knee and ankle range of motion and strength    Currently in Pain? No/denies    Pain Score 0-No pain                [facility] PT Assessment - 10/14/21 0001       Assessment   Medical Diagnosis post op bunionectomy, Left hallux valgus    Referring Provider (PT) Hardie Pulley    Onset Date/Surgical Date 05/14/21    Hand Dominance Right    Next MD Visit PRN      Single Leg Stance   Comments LLE 5 sec      AROM   Left Knee Flexion 84    Left Ankle Dorsiflexion 5   PROM 9     Flexibility   Quadriceps LLE 75               [facility] Adult PT Treatment/Exercise - 10/14/21 0001       Knee/Hip Exercises: Stretches   Ambulance person reps;30 seconds    Quad Stretch Limitations prone with strap    ITB Stretch Right;Left;1 rep;20 seconds    Gastroc Stretch Right;Left;2 reps;20  seconds   runners stretch   Other Knee/Hip Stretches forward lunge with foot on 12" step, followed by hamstring stretch x 5 reps      Knee/Hip Exercises: Aerobic   Nustep L5: arms/legs x 6 min for warm up      Knee/Hip Exercises: Machines for Strengthening   Total Gym Leg Press 6 plates x 20   seat 7     Knee/Hip Exercises: Standing   Forward Step Up Left;1 set;10 reps;Hand Hold: 2;Step Height: 6"    Other Standing Knee Exercises side to side lunge leaning on counter x 10 each direction      Knee/Hip Exercises: Seated   Heel Slides Limitations seated scoot LLE x 2 reps      Knee/Hip Exercises: Supine   Bridges 5 reps    Straight Leg Raises Strengthening;Left;Right;1 set;10 reps              PT Long Term Goals - 10/14/21 1434       PT LONG TERM GOAL #1   Title Pt will be independent  with HEP    Status Achieved    Target Date 11/18/21      PT LONG TERM GOAL #2   Title Pt will improve Lt ankle ROM by 10 degrees in DF and PF to improve gait and standing tolerance    Period Weeks    Status Partially Met      PT LONG TERM GOAL #3   Title Pt will improve balance to perform SLS x 10 seconds on bilat LE to demo reduced fall risk    Time 6    Period Weeks    Status Not Met      PT LONG TERM GOAL #4   Title Pt will improve Lt knee flexion ROM by 10 degrees to improve gait tolerance    Status Not Met    Target Date 11/18/21                   Plan - 10/14/21 1503     Clinical Impression Statement Pt's Lt ankle and knee ROM similar to eval, but pt reporting improved functional mobility.  SLS improved from 1 sec to 5 sec.  Pt has partially met her goals and is pleased with level of function. Pt requests to d/c at this time.    Personal Factors and Comorbidities Profession;Comorbidity 2;Age    Examination-Activity Limitations Stand;Locomotion Level;Stairs    Examination-Participation Restrictions Occupation;Community Activity    PT Frequency 1x / week    PT  Duration 6 weeks    PT Treatment/Interventions Aquatic Therapy;Cryotherapy;Electrical Stimulation;Iontophoresis 48m/ml Dexamethasone;Moist Heat;Therapeutic exercise;Balance training;Neuromuscular re-education;Gait training;Stair training;Therapeutic activities;Patient/family education;Manual techniques;Dry needling;Taping;Vasopneumatic Device;Passive range of motion    PT Next Visit Plan d/c to HEP per pt request.    PT Home Exercise Plan [email or web address]             Patient will benefit from skilled therapeutic intervention in order to improve the following deficits and impairments:  Impaired flexibility, Decreased strength, Decreased activity tolerance, Decreased range of motion, Increased edema, Hypomobility, Difficulty walking, Decreased balance  Visit Diagnosis: Difficulty in walking, not elsewhere classified  Stiffness of left knee, not elsewhere classified  Stiffness of left ankle, not elsewhere classified  Other symptoms and signs involving the musculoskeletal system     Problem List Patient Active Problem List   Diagnosis Date Noted   Wrist sprain, left, initial encounter 07/03/2020   Vulvar carcinoma ([place] 07/13/2019   History of total knee replacement, 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   [place] cardiac risk >20% in next 10 years 05/16/2019   History of cancer of vulva 04/13/2019   Closed fracture of distal end of humerus, unspecified fracture morphology, initial encounter 06/22/2018   Closed nondisplaced fracture of head of right radius 06/22/2018   Trigger middle finger of left hand 09/14/2017   Mor[place] excess calories ([place] 01/26/2017   Sedentary lifestyle 01/26/2017   DDD (degenerative disc disease), lumbar 11/11/2016   Chronic right-sided low back pain with right-sided sciatica 11/09/2016   Aromatase inhibitor use 09/22/2016   At risk for breast cancer 09/22/2016   Therapeutic drug monitoring 09/22/2016   Mixed hyperlipidemia 08/02/2016   Type 2 diabetes  mellitus without complication, without long-term current use of insulin ([place] 07/26/2016   Colon polyps 10/04/2014   Essential hypertension 10/04/2014   History of gastric ulcer 10/04/2014   Malignant neoplasm of lower-inner quadrant of left female breast ([place] 10/03/2014   PHYSICAL THERAPY DISCHARGE SUMMARY  Visits from Start of Care: 8  Current functional level related to goals /  functional outcomes: Improved gait, strength and activity tolerance   Remaining deficits: Decreased knee ROM   Education / Equipment: HEP   Patient agrees to discharge. Patient goals were partially met. Patient is being discharged due to being pleased with the current functional level. Isabelle Course, PT,DPT12/27/228:33 AM   Kerin Perna, PTA 10/14/21 3:08 PM  [place], [place], [address] Phone: [phone]   Fax:  [phone]  Name: Angie [name] MRN: [id number] Date of Birth: Jul 03, 1946

## 2021-10-21 ENCOUNTER — Encounter: Payer: Medicare Other | Admitting: Physical Therapy

## 2021-11-24 ENCOUNTER — Ambulatory Visit: Payer: Medicare Other | Admitting: Podiatry

## 2021-12-01 ENCOUNTER — Emergency Department (HOSPITAL_BASED_OUTPATIENT_CLINIC_OR_DEPARTMENT_OTHER): Payer: Medicare Other | Admitting: Radiology

## 2021-12-01 ENCOUNTER — Other Ambulatory Visit: Payer: Self-pay

## 2021-12-01 ENCOUNTER — Emergency Department (HOSPITAL_BASED_OUTPATIENT_CLINIC_OR_DEPARTMENT_OTHER)
Admission: EM | Admit: 2021-12-01 | Discharge: 2021-12-01 | Disposition: A | Discharge status: Home / Self Care | Payer: Medicare Other | Attending: Emergency Medicine | Admitting: Emergency Medicine

## 2021-12-01 ENCOUNTER — Encounter (HOSPITAL_BASED_OUTPATIENT_CLINIC_OR_DEPARTMENT_OTHER): Payer: Self-pay | Admitting: Pediatrics

## 2021-12-01 DIAGNOSIS — I10 Essential (primary) hypertension: Secondary | ICD-10-CM | POA: Diagnosis not present

## 2021-12-01 DIAGNOSIS — Y92410 Unspecified street and highway as the place of occurrence of the external cause: Secondary | ICD-10-CM | POA: Insufficient documentation

## 2021-12-01 DIAGNOSIS — M542 Cervicalgia: Secondary | ICD-10-CM | POA: Diagnosis not present

## 2021-12-01 DIAGNOSIS — Z79899 Other long term (current) drug therapy: Secondary | ICD-10-CM | POA: Diagnosis not present

## 2021-12-01 DIAGNOSIS — S299XXA Unspecified injury of thorax, initial encounter: Secondary | ICD-10-CM | POA: Diagnosis present

## 2021-12-01 DIAGNOSIS — R519 Headache, unspecified: Secondary | ICD-10-CM | POA: Diagnosis not present

## 2021-12-01 DIAGNOSIS — R109 Unspecified abdominal pain: Secondary | ICD-10-CM | POA: Insufficient documentation

## 2021-12-01 LAB — BASIC METABOLIC PANEL
Anion gap: 10 (ref 5–15)
BUN: 15 mg/dL (ref 8–23)
CO2: 24 mmol/L (ref 22–32)
Calcium: 9.4 mg/dL (ref 8.9–10.3)
Chloride: 106 mmol/L (ref 98–111)
Creatinine, Ser: 0.8 mg/dL (ref 0.44–1.00)
GFR, Estimated: >60 mL/min (ref >60)
Glucose, Bld: 98 mg/dL (ref 70–99)
Potassium: 4.3 mmol/L (ref 3.5–5.1)
Sodium: 140 mmol/L (ref 135–145)

## 2021-12-01 LAB — CBC WITH DIFFERENTIAL/PLATELET
Abs Immature Granulocytes: 0 10*3/uL (ref 0.00–0.07)
Basophils Absolute: 0.1 10*3/uL (ref 0.0–0.1)
Basophils Relative: 1 %
Eosinophils Absolute: 0 10*3/uL (ref 0.0–0.5)
Eosinophils Relative: 0 %
HCT: 44.7 % (ref 36.0–46.0)
Hemoglobin: 13.3 g/dL (ref 12.0–15.0)
Lymphocytes Relative: 25 %
Lymphs Abs: 1.5 10*3/uL (ref 0.7–4.0)
MCH: 26.1 pg (ref 26.0–34.0)
MCHC: 29.8 g/dL — ABNORMAL LOW (ref 30.0–36.0)
MCV: 87.6 fL (ref 80.0–100.0)
Monocytes Absolute: 0.7 10*3/uL (ref 0.1–1.0)
Monocytes Relative: 11 %
Neutro Abs: 3.8 10*3/uL (ref 1.7–7.7)
Neutrophils Relative %: 63 %
Platelets: 123 10*3/uL — ABNORMAL LOW (ref 150–400)
RBC: 5.1 MIL/uL (ref 3.87–5.11)
RDW: 15.3 % (ref 11.5–15.5)
Smear Review: NORMAL
WBC: 6.1 10*3/uL (ref 4.0–10.5)
nRBC: 0 % (ref 0.0–0.2)

## 2021-12-01 LAB — TROPONIN I (HIGH SENSITIVITY): Troponin I (High Sensitivity): 4 ng/L (ref <18)

## 2021-12-01 MED ORDER — ALBUTEROL SULFATE HFA 108 (90 BASE) MCG/ACT IN AERS: 2.0000 | INHALATION_SPRAY | RESPIRATORY_TRACT | Status: DC | PRN

## 2021-12-01 NOTE — ED Notes (Signed)
Patient verbalizes understanding of discharge instructions. Opportunity for questioning and answers were provided. Patient discharged from ED.  °

## 2021-12-01 NOTE — Discharge Instructions (Addendum)
If you develop new or worsening chest pain, trouble breathing, severe headache, vomiting, or any other new/concerning symptoms return to the ER for evaluation.

## 2021-12-01 NOTE — ED Notes (Signed)
Patient transported to X-ray 
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
## 2022-12-06 ENCOUNTER — Ambulatory Visit: Payer: Medicare Other | Admitting: Podiatry

## 2022-12-06 ENCOUNTER — Encounter: Payer: Self-pay | Admitting: Podiatry

## 2022-12-06 ENCOUNTER — Ambulatory Visit (INDEPENDENT_AMBULATORY_CARE_PROVIDER_SITE_OTHER): Payer: Medicare Other

## 2022-12-06 DIAGNOSIS — S9031XA Contusion of right foot, initial encounter: Secondary | ICD-10-CM

## 2022-12-06 NOTE — Progress Notes (Signed)
  Subjective:  Patient ID: Angie [name], female    DOB: Mar 15, 1946,   MRN: [id number]  Chief Complaint  Patient presents with   Foot Pain    Patient states that she dropped a board on her foot last Thursday. She states that her foot has been swollen ever since. She has soaked her foot in epsom salt and green alcohol.     77 y.o. female presents for right foot injury. Relates she dropped a board on her foot four days ago. She relates pain swelling and redness in the foot . Has been soaking her foot in epsom salts and "green alcohol: Relates pain has not been improving.   . Denies any other pedal complaints. Denies n/v/f/c.   History reviewed. No pertinent past medical history.  Objective:  Physical Exam: Vascular: DP/PT pulses 2/4 bilateral. CFT <3 seconds. Normal hair growth on digits. No edema.  Skin. No lacerations or abrasions bilateral feet.  Musculoskeletal: MMT 5/5 bilateral lower extremities in DF, PF, Inversion and Eversion. Deceased ROM in DF of ankle joint. Tender over the dorsum of the first second and third metatarsals on right. Limited exam due to pain. No pain with ROM of the digits.  Neurological: Sensation intact to light touch.   Assessment:   1. Contusion of right foot, initial encounter      Plan:  Patient was evaluated and treated and all questions answered. -Xrays reviewed. No acute fractures or dislocations noted.  -Discussed treatement options for contusion of the foot; risks, alternatives, and benefits explained. -Dispensed surgical shoe. Patient to wear at all times and instructed on use -Recommend protection, rest, ice, elevation daily until symptoms improve -Rx pain med/antinflammatories as needed -Patient to return to office if needed in a few weeks if pain has not resolved.    Lorenda Peck, DPM

## 2023-02-10 ENCOUNTER — Encounter: Payer: Self-pay | Admitting: *Deleted
